# Patient Record
Sex: Female | Born: 1980
Health system: Southern US, Community
[De-identification: ages and names within clinical notes are randomized; demographics above are authoritative.]

## PROBLEM LIST (undated history)

## (undated) ENCOUNTER — Inpatient Hospital Stay (HOSPITAL_COMMUNITY): Payer: Self-pay

## (undated) DIAGNOSIS — F329 Major depressive disorder, single episode, unspecified: Secondary | ICD-10-CM

## (undated) DIAGNOSIS — E282 Polycystic ovarian syndrome: Secondary | ICD-10-CM

## (undated) DIAGNOSIS — R87619 Unspecified abnormal cytological findings in specimens from cervix uteri: Secondary | ICD-10-CM

## (undated) DIAGNOSIS — F99 Mental disorder, not otherwise specified: Secondary | ICD-10-CM

## (undated) DIAGNOSIS — R51 Headache: Secondary | ICD-10-CM

## (undated) DIAGNOSIS — N3010 Interstitial cystitis (chronic) without hematuria: Secondary | ICD-10-CM

## (undated) DIAGNOSIS — K589 Irritable bowel syndrome without diarrhea: Secondary | ICD-10-CM

## (undated) DIAGNOSIS — R7303 Prediabetes: Secondary | ICD-10-CM

## (undated) DIAGNOSIS — E119 Type 2 diabetes mellitus without complications: Secondary | ICD-10-CM

## (undated) DIAGNOSIS — F32A Depression, unspecified: Secondary | ICD-10-CM

## (undated) DIAGNOSIS — IMO0002 Reserved for concepts with insufficient information to code with codable children: Secondary | ICD-10-CM

## (undated) DIAGNOSIS — I10 Essential (primary) hypertension: Secondary | ICD-10-CM

## (undated) DIAGNOSIS — M797 Fibromyalgia: Secondary | ICD-10-CM

## (undated) DIAGNOSIS — F419 Anxiety disorder, unspecified: Secondary | ICD-10-CM

## (undated) DIAGNOSIS — K219 Gastro-esophageal reflux disease without esophagitis: Secondary | ICD-10-CM

## (undated) DIAGNOSIS — Z3490 Encounter for supervision of normal pregnancy, unspecified, unspecified trimester: Secondary | ICD-10-CM

## (undated) HISTORY — PX: KNEE SURGERY: SHX244

## (undated) HISTORY — DX: Reserved for concepts with insufficient information to code with codable children: IMO0002

## (undated) HISTORY — PX: CHOLECYSTECTOMY: SHX55

## (undated) HISTORY — PX: APPENDECTOMY: SHX54

## (undated) HISTORY — DX: Mental disorder, not otherwise specified: F99

## (undated) HISTORY — DX: Headache: R51

## (undated) HISTORY — DX: Major depressive disorder, single episode, unspecified: F32.9

## (undated) HISTORY — PX: BREAST REDUCTION SURGERY: SHX8

## (undated) HISTORY — DX: Unspecified abnormal cytological findings in specimens from cervix uteri: R87.619

## (undated) HISTORY — DX: Interstitial cystitis (chronic) without hematuria: N30.10

## (undated) HISTORY — DX: Depression, unspecified: F32.A

## (undated) HISTORY — DX: Gastro-esophageal reflux disease without esophagitis: K21.9

---
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
## 2002-05-01 ENCOUNTER — Ambulatory Visit (HOSPITAL_BASED_OUTPATIENT_CLINIC_OR_DEPARTMENT_OTHER): Admission: RE | Admit: 2002-05-01 | Discharge: 2002-05-02 | Payer: Self-pay | Admitting: Specialist

## 2002-05-01 ENCOUNTER — Encounter (INDEPENDENT_AMBULATORY_CARE_PROVIDER_SITE_OTHER): Payer: Self-pay | Admitting: Specialist

## 2002-06-18 ENCOUNTER — Encounter (INDEPENDENT_AMBULATORY_CARE_PROVIDER_SITE_OTHER): Payer: Self-pay | Admitting: *Deleted

## 2002-06-18 ENCOUNTER — Inpatient Hospital Stay (HOSPITAL_COMMUNITY): Admission: EM | Admit: 2002-06-18 | Discharge: 2002-06-20 | Payer: Self-pay | Admitting: Emergency Medicine

## 2002-06-18 ENCOUNTER — Encounter: Payer: Self-pay | Admitting: Emergency Medicine

## 2002-11-21 ENCOUNTER — Emergency Department (HOSPITAL_COMMUNITY): Admission: EM | Admit: 2002-11-21 | Discharge: 2002-11-21 | Payer: Self-pay | Admitting: Emergency Medicine

## 2002-12-21 ENCOUNTER — Encounter: Payer: Self-pay | Admitting: Family Medicine

## 2002-12-21 ENCOUNTER — Encounter: Admission: RE | Admit: 2002-12-21 | Discharge: 2002-12-21 | Payer: Self-pay | Admitting: Family Medicine

## 2003-05-11 ENCOUNTER — Emergency Department (HOSPITAL_COMMUNITY): Admission: EM | Admit: 2003-05-11 | Discharge: 2003-05-11 | Payer: Self-pay | Admitting: Emergency Medicine

## 2004-08-08 ENCOUNTER — Emergency Department (HOSPITAL_COMMUNITY): Admission: EM | Admit: 2004-08-08 | Discharge: 2004-08-08 | Payer: Self-pay | Admitting: Emergency Medicine

## 2005-03-24 ENCOUNTER — Ambulatory Visit (HOSPITAL_COMMUNITY): Admission: RE | Admit: 2005-03-24 | Discharge: 2005-03-24 | Payer: Self-pay | Admitting: Family Medicine

## 2006-09-17 ENCOUNTER — Inpatient Hospital Stay (HOSPITAL_COMMUNITY): Admission: AD | Admit: 2006-09-17 | Discharge: 2006-09-19 | Payer: Self-pay | Admitting: Obstetrics and Gynecology

## 2007-02-27 ENCOUNTER — Emergency Department (HOSPITAL_COMMUNITY): Admission: EM | Admit: 2007-02-27 | Discharge: 2007-02-27 | Payer: Self-pay | Admitting: Family Medicine

## 2007-03-15 ENCOUNTER — Emergency Department (HOSPITAL_COMMUNITY): Admission: EM | Admit: 2007-03-15 | Discharge: 2007-03-15 | Payer: Self-pay | Admitting: Family Medicine

## 2007-03-20 ENCOUNTER — Emergency Department (HOSPITAL_COMMUNITY): Admission: EM | Admit: 2007-03-20 | Discharge: 2007-03-21 | Payer: Self-pay | Admitting: Emergency Medicine

## 2007-05-03 ENCOUNTER — Emergency Department (HOSPITAL_COMMUNITY): Admission: EM | Admit: 2007-05-03 | Discharge: 2007-05-03 | Payer: Self-pay | Admitting: Family Medicine

## 2007-08-19 ENCOUNTER — Emergency Department (HOSPITAL_COMMUNITY): Admission: EM | Admit: 2007-08-19 | Discharge: 2007-08-19 | Payer: Self-pay | Admitting: Emergency Medicine

## 2007-08-30 ENCOUNTER — Emergency Department (HOSPITAL_COMMUNITY): Admission: EM | Admit: 2007-08-30 | Discharge: 2007-08-30 | Payer: Self-pay | Admitting: Emergency Medicine

## 2007-09-10 ENCOUNTER — Emergency Department (HOSPITAL_COMMUNITY): Admission: EM | Admit: 2007-09-10 | Discharge: 2007-09-10 | Payer: Self-pay | Admitting: Emergency Medicine

## 2007-11-06 ENCOUNTER — Emergency Department (HOSPITAL_COMMUNITY): Admission: EM | Admit: 2007-11-06 | Discharge: 2007-11-06 | Payer: Self-pay | Admitting: Emergency Medicine

## 2008-04-19 ENCOUNTER — Emergency Department (HOSPITAL_COMMUNITY): Admission: EM | Admit: 2008-04-19 | Discharge: 2008-04-19 | Payer: Self-pay | Admitting: Family Medicine

## 2008-05-01 ENCOUNTER — Emergency Department (HOSPITAL_COMMUNITY): Admission: EM | Admit: 2008-05-01 | Discharge: 2008-05-01 | Payer: Self-pay | Admitting: Emergency Medicine

## 2008-05-17 ENCOUNTER — Emergency Department (HOSPITAL_COMMUNITY): Admission: EM | Admit: 2008-05-17 | Discharge: 2008-05-18 | Payer: Self-pay | Admitting: Emergency Medicine

## 2008-06-04 ENCOUNTER — Ambulatory Visit (HOSPITAL_COMMUNITY): Admission: RE | Admit: 2008-06-04 | Discharge: 2008-06-04 | Payer: Self-pay | Admitting: Gastroenterology

## 2008-06-05 ENCOUNTER — Ambulatory Visit (HOSPITAL_COMMUNITY): Admission: RE | Admit: 2008-06-05 | Discharge: 2008-06-05 | Payer: Self-pay | Admitting: Gastroenterology

## 2008-08-29 ENCOUNTER — Emergency Department (HOSPITAL_COMMUNITY): Admission: EM | Admit: 2008-08-29 | Discharge: 2008-08-29 | Payer: Self-pay | Admitting: Family Medicine

## 2008-09-24 ENCOUNTER — Ambulatory Visit (HOSPITAL_COMMUNITY): Admission: RE | Admit: 2008-09-24 | Discharge: 2008-09-24 | Payer: Self-pay | Admitting: General Surgery

## 2008-09-24 ENCOUNTER — Encounter (INDEPENDENT_AMBULATORY_CARE_PROVIDER_SITE_OTHER): Payer: Self-pay | Admitting: General Surgery

## 2008-11-06 ENCOUNTER — Emergency Department (HOSPITAL_COMMUNITY): Admission: EM | Admit: 2008-11-06 | Discharge: 2008-11-06 | Payer: Self-pay | Admitting: Family Medicine

## 2008-11-26 ENCOUNTER — Encounter: Admission: RE | Admit: 2008-11-26 | Discharge: 2009-01-02 | Payer: Self-pay | Admitting: Family Medicine

## 2009-01-26 ENCOUNTER — Emergency Department (HOSPITAL_COMMUNITY): Admission: EM | Admit: 2009-01-26 | Discharge: 2009-01-26 | Payer: Self-pay | Admitting: Family Medicine

## 2009-04-01 ENCOUNTER — Emergency Department (HOSPITAL_COMMUNITY): Admission: EM | Admit: 2009-04-01 | Discharge: 2009-04-01 | Payer: Self-pay | Admitting: Family Medicine

## 2009-04-17 ENCOUNTER — Emergency Department (HOSPITAL_COMMUNITY): Admission: EM | Admit: 2009-04-17 | Discharge: 2009-04-17 | Payer: Self-pay | Admitting: Family Medicine

## 2009-05-29 ENCOUNTER — Emergency Department (HOSPITAL_COMMUNITY): Admission: EM | Admit: 2009-05-29 | Discharge: 2009-05-29 | Payer: Self-pay | Admitting: Emergency Medicine

## 2009-08-13 ENCOUNTER — Emergency Department (HOSPITAL_COMMUNITY): Admission: EM | Admit: 2009-08-13 | Discharge: 2009-08-13 | Payer: Self-pay | Admitting: Family Medicine

## 2009-08-15 IMAGING — CR DG ABDOMEN 2V
2 series · 2 of 2 positions shown · non-contrast
Comparison: None

CLINICAL DATA: Shortness of breath, vomiting

ABDOMEN - 2 VIEW

[view not recorded (1 of 2)]
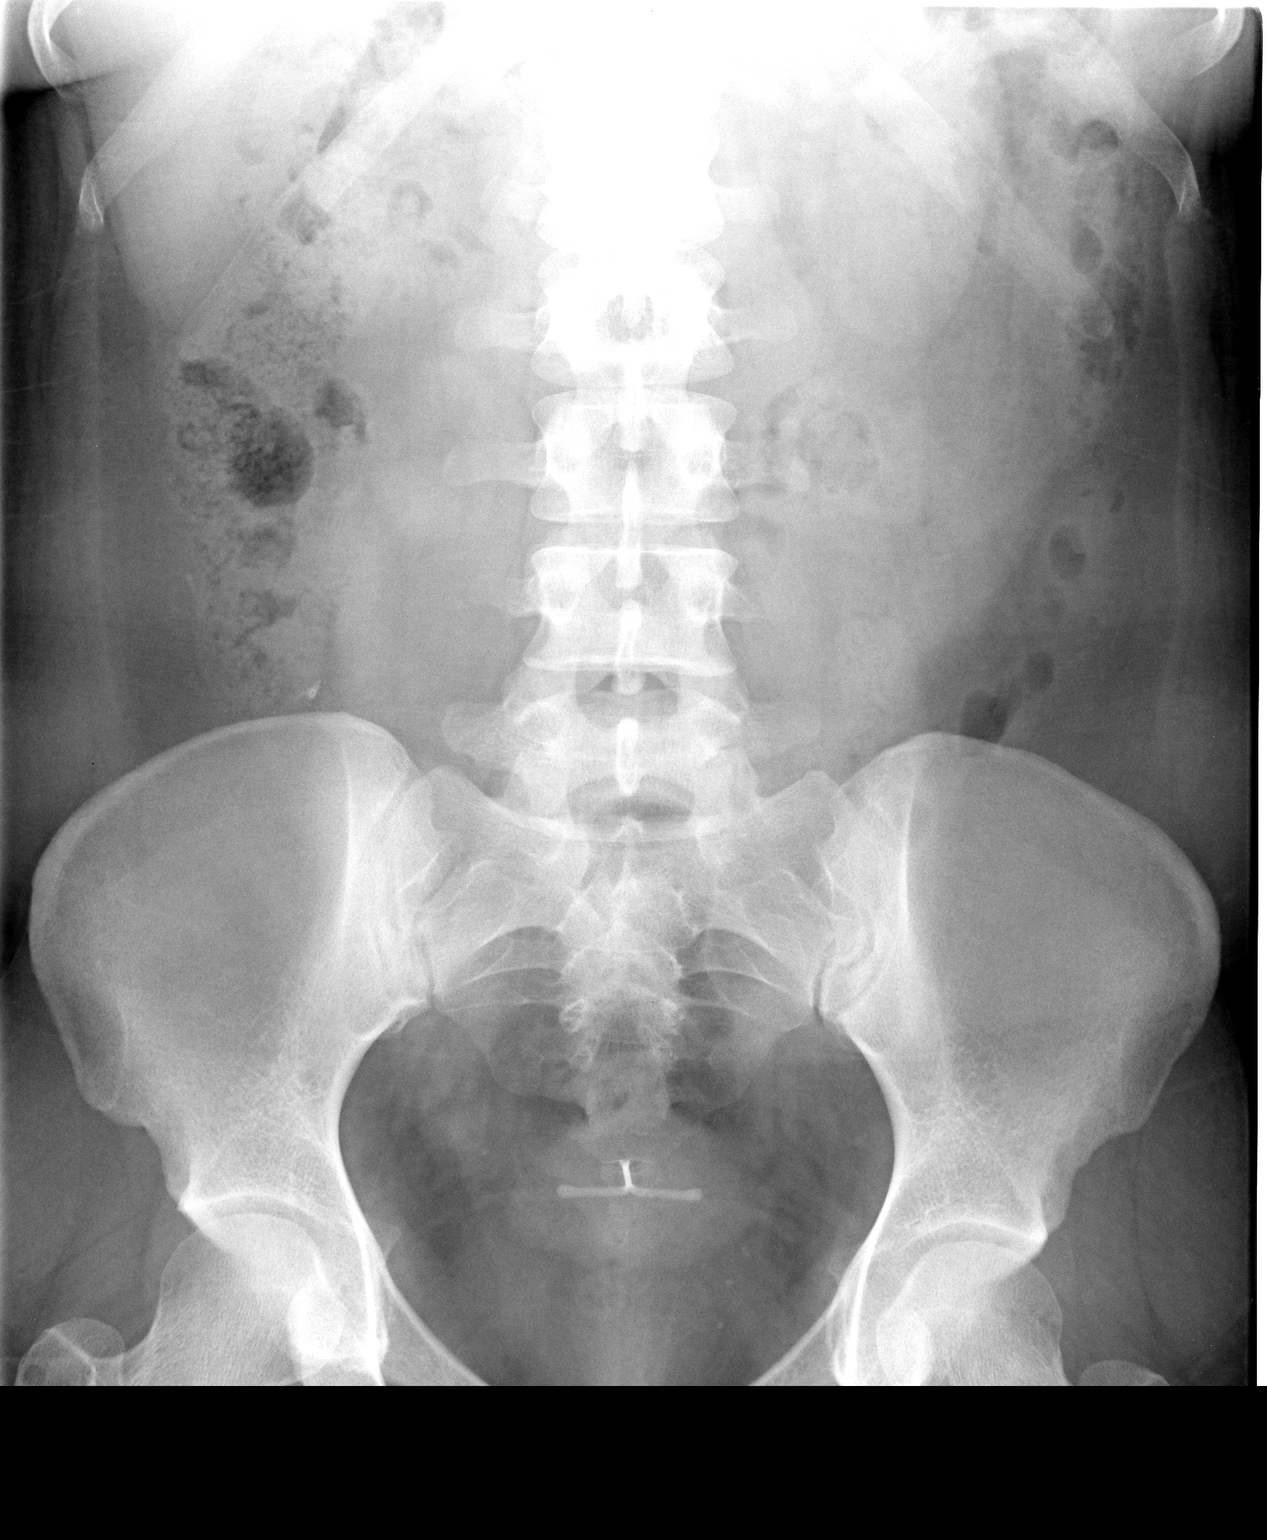

[view not recorded (2 of 2)]
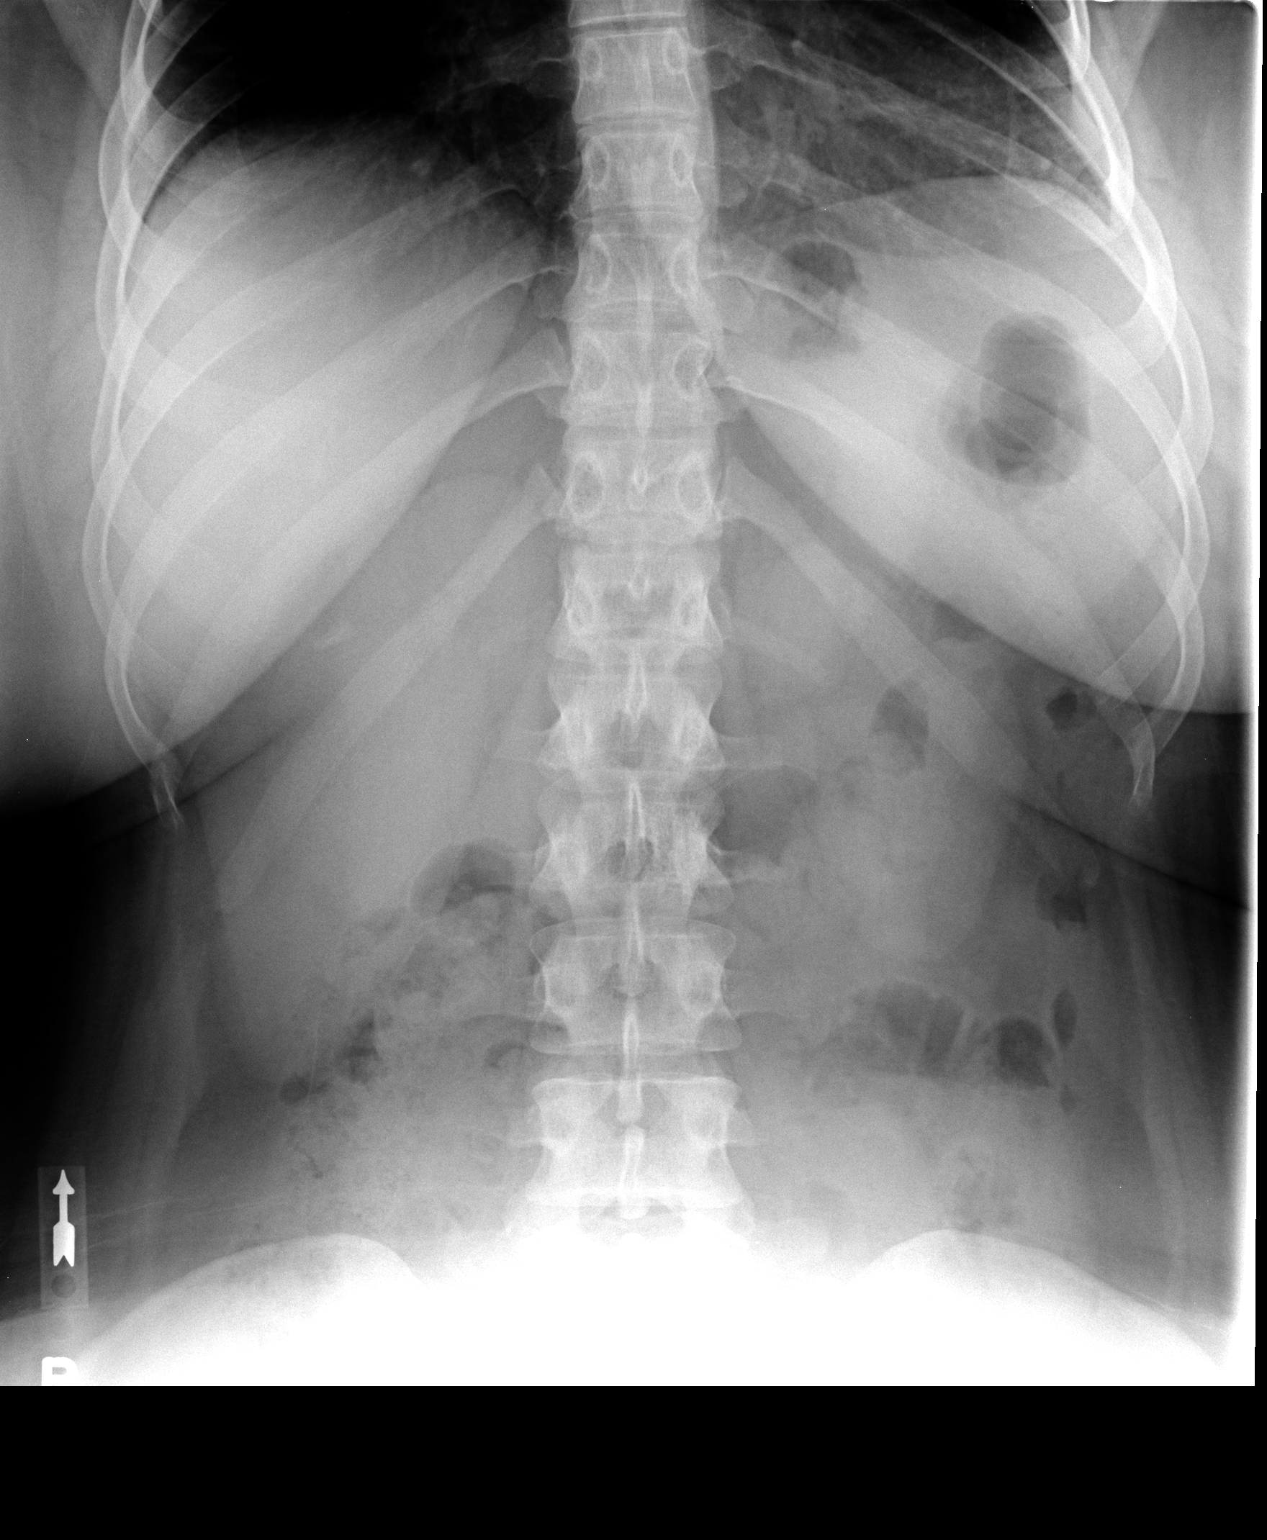

[2 of 2 positions shown; findings below may reference images not displayed]

FINDINGS: There is normal bowel gas pattern.  No free air.  No
organomegaly or suspicious calcification.  Visualized skeleton
unremarkable.  IUD is in place.
IMPRESSION: No acute findings.

## 2009-08-15 IMAGING — CR DG CHEST 2V
2 series · 2 of 2 positions shown · non-contrast
Comparison: 11/06/2007

CLINICAL DATA: Shortness of breath

CHEST - 2 VIEW

[view not recorded (1 of 2)]
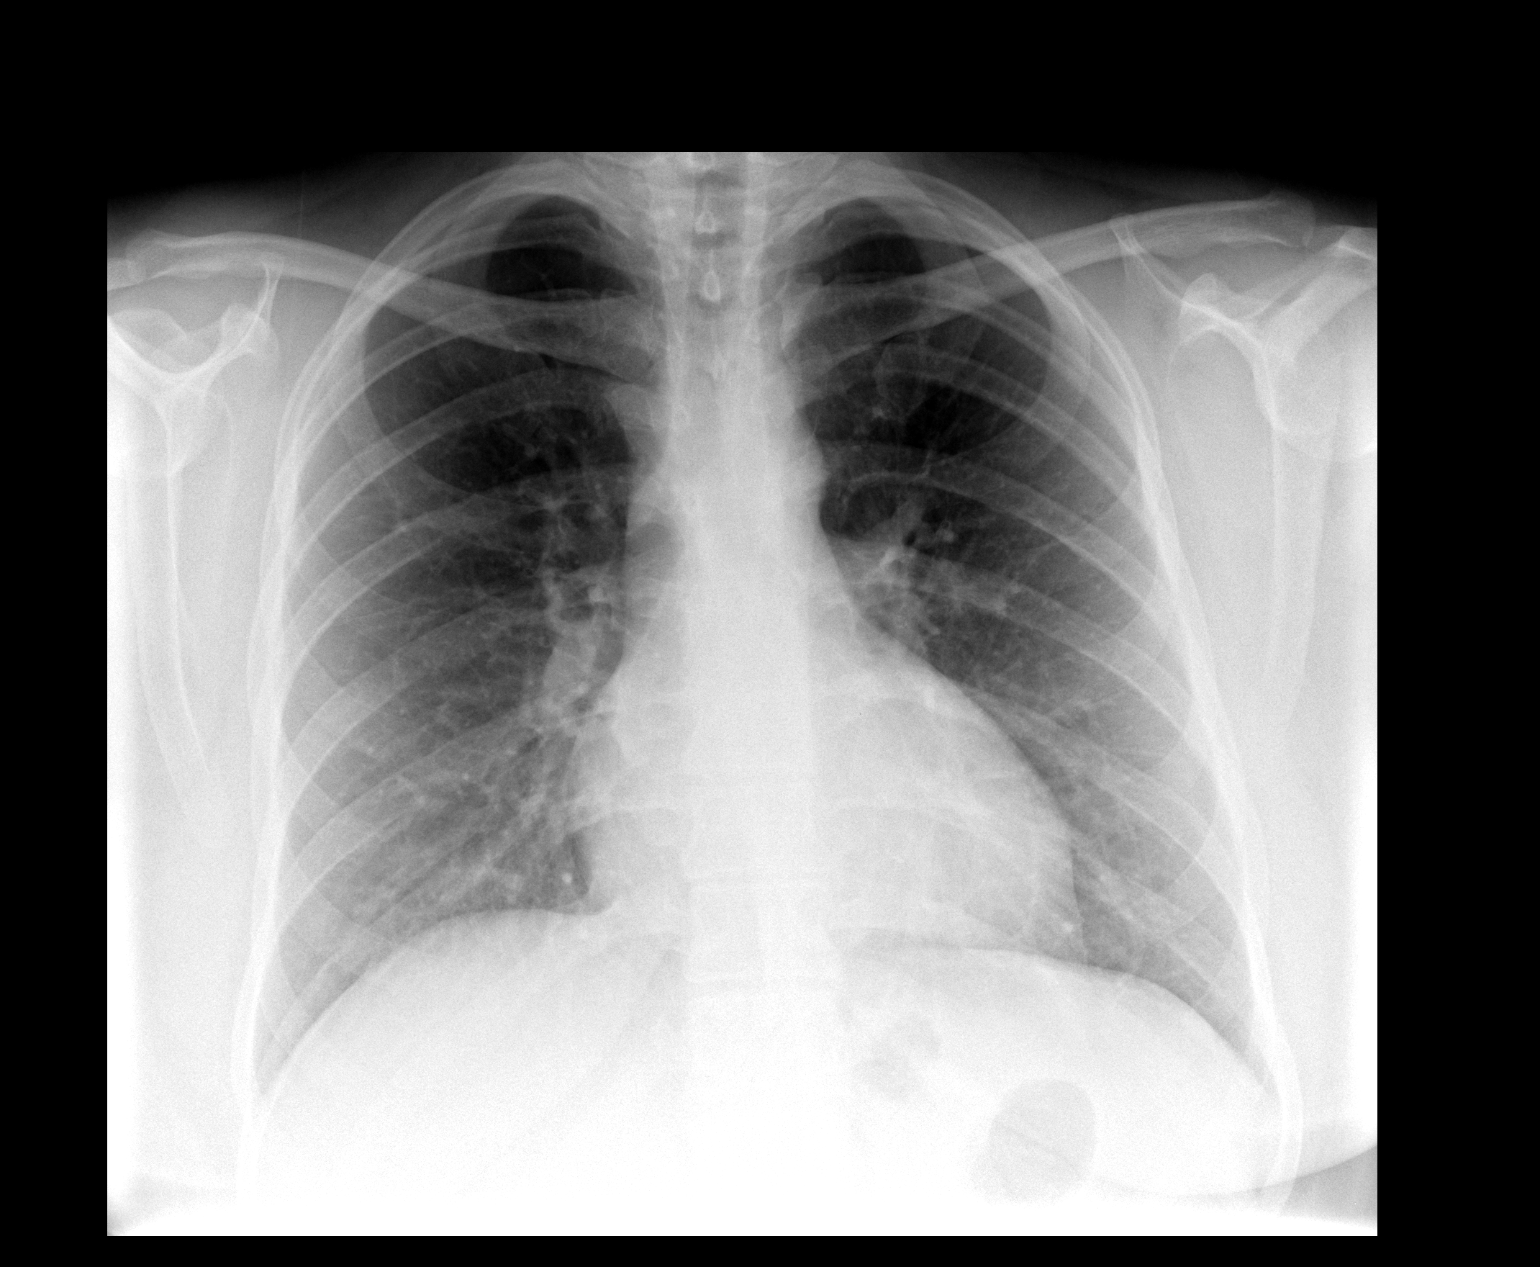

[view not recorded (2 of 2)]
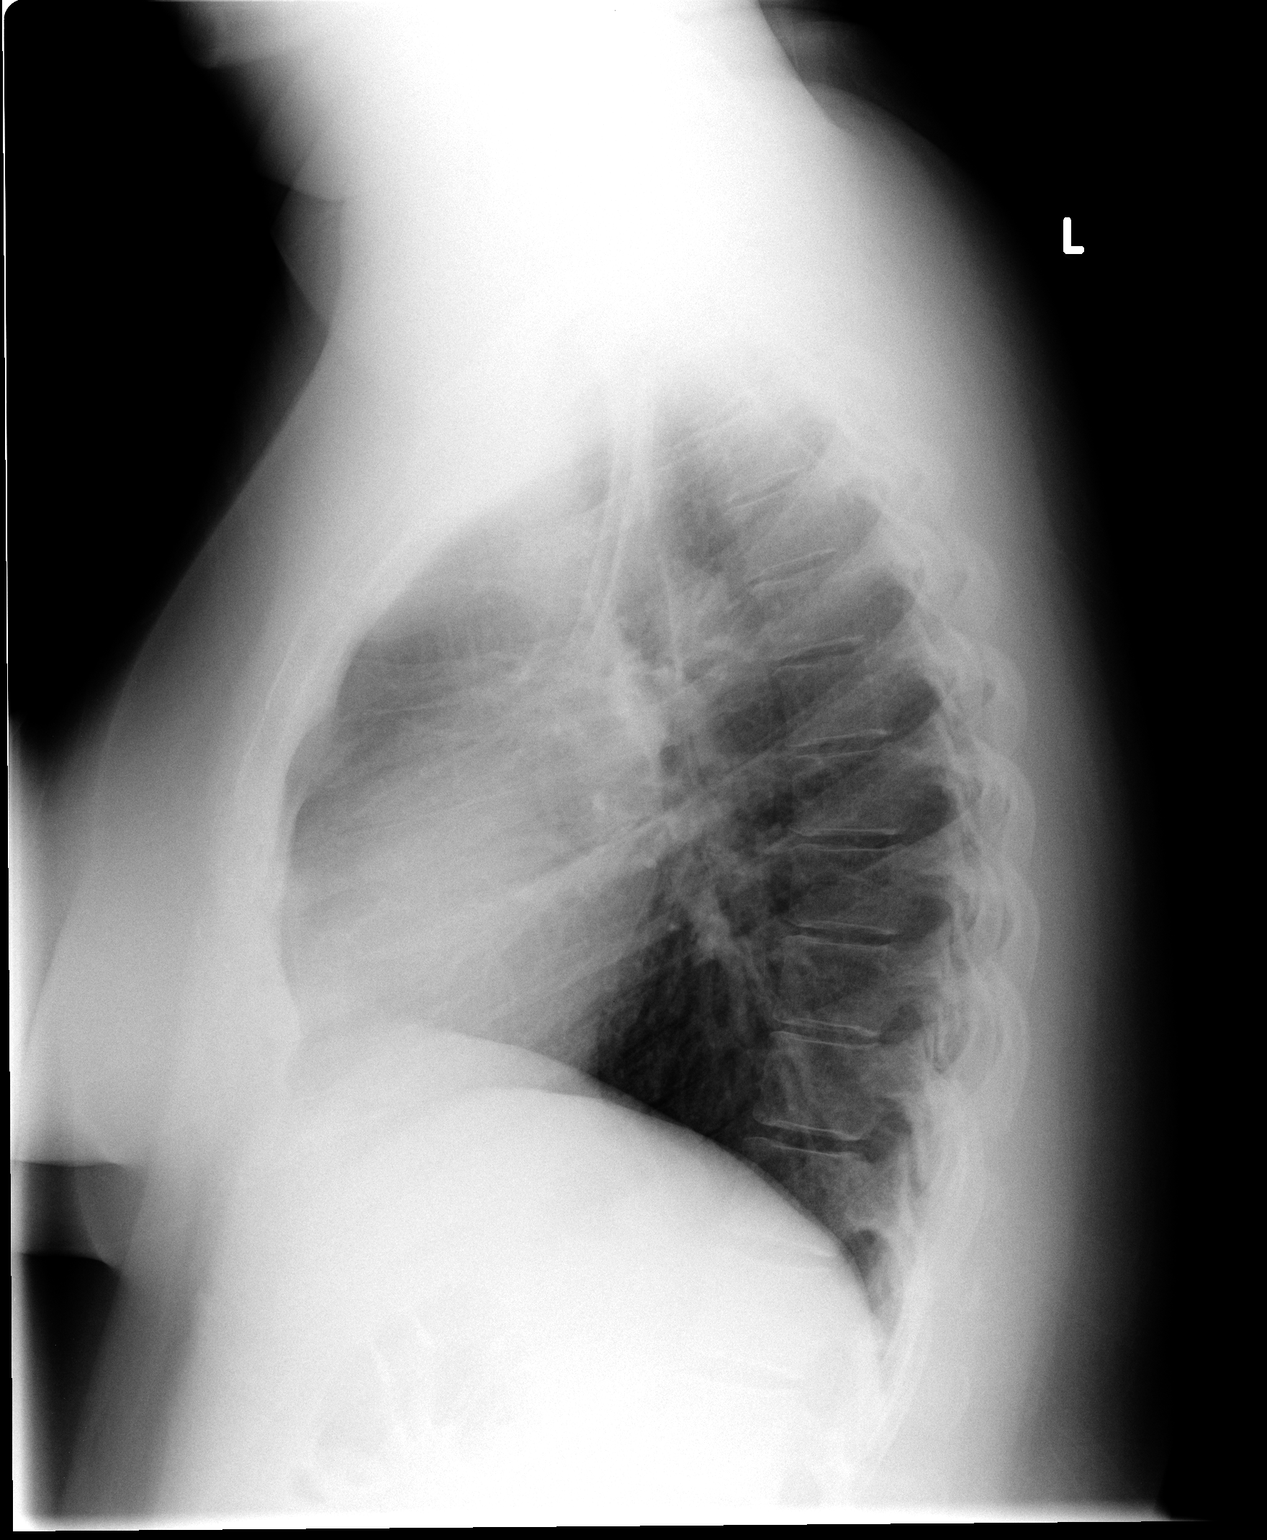

[2 of 2 positions shown; findings below may reference images not displayed]

FINDINGS: Heart and mediastinal contours are within normal limits.
No focal opacities or effusions.  No acute bony abnormality.
IMPRESSION: No active disease.

## 2010-01-02 ENCOUNTER — Emergency Department (HOSPITAL_COMMUNITY): Admission: EM | Admit: 2010-01-02 | Discharge: 2010-01-02 | Payer: Self-pay | Admitting: Family Medicine

## 2010-07-15 ENCOUNTER — Emergency Department (HOSPITAL_COMMUNITY): Admission: EM | Admit: 2010-07-15 | Discharge: 2010-07-15 | Payer: Self-pay | Admitting: Family Medicine

## 2010-08-10 ENCOUNTER — Emergency Department (HOSPITAL_COMMUNITY): Admission: EM | Admit: 2010-08-10 | Discharge: 2010-08-10 | Payer: Self-pay | Admitting: Family Medicine

## 2010-11-13 ENCOUNTER — Emergency Department (HOSPITAL_COMMUNITY)
Admission: EM | Admit: 2010-11-13 | Discharge: 2010-11-13 | Payer: Self-pay | Source: Home / Self Care | Admitting: Family Medicine

## 2010-11-13 LAB — POCT RAPID STREP A (OFFICE): Streptococcus, Group A Screen (Direct): NEGATIVE

## 2010-11-15 ENCOUNTER — Emergency Department (HOSPITAL_COMMUNITY)
Admission: EM | Admit: 2010-11-15 | Discharge: 2010-11-15 | Payer: Self-pay | Source: Home / Self Care | Admitting: Family Medicine

## 2010-11-17 ENCOUNTER — Emergency Department (HOSPITAL_COMMUNITY)
Admission: EM | Admit: 2010-11-17 | Discharge: 2010-11-17 | Payer: Self-pay | Source: Home / Self Care | Admitting: Family Medicine

## 2010-12-13 ENCOUNTER — Inpatient Hospital Stay (INDEPENDENT_AMBULATORY_CARE_PROVIDER_SITE_OTHER)
Admission: RE | Admit: 2010-12-13 | Discharge: 2010-12-13 | Disposition: A | Discharge status: Home / Self Care | Payer: 59 | Source: Ambulatory Visit | Attending: Family Medicine | Admitting: Family Medicine

## 2010-12-13 DIAGNOSIS — H6980 Other specified disorders of Eustachian tube, unspecified ear: Secondary | ICD-10-CM

## 2010-12-28 ENCOUNTER — Inpatient Hospital Stay (HOSPITAL_COMMUNITY): Payer: 59

## 2010-12-28 ENCOUNTER — Inpatient Hospital Stay (INDEPENDENT_AMBULATORY_CARE_PROVIDER_SITE_OTHER)
Admission: RE | Admit: 2010-12-28 | Discharge: 2010-12-28 | Disposition: A | Discharge status: Home / Self Care | Payer: 59 | Source: Ambulatory Visit | Attending: Emergency Medicine | Admitting: Emergency Medicine

## 2010-12-28 ENCOUNTER — Inpatient Hospital Stay (HOSPITAL_COMMUNITY)
Admission: AD | Admit: 2010-12-28 | Discharge: 2010-12-28 | Disposition: A | Discharge status: Home / Self Care | Payer: 59 | Source: Ambulatory Visit | Attending: Obstetrics & Gynecology | Admitting: Obstetrics & Gynecology

## 2010-12-28 DIAGNOSIS — N949 Unspecified condition associated with female genital organs and menstrual cycle: Secondary | ICD-10-CM

## 2010-12-28 DIAGNOSIS — N83209 Unspecified ovarian cyst, unspecified side: Secondary | ICD-10-CM

## 2010-12-28 DIAGNOSIS — R1032 Left lower quadrant pain: Secondary | ICD-10-CM

## 2010-12-28 LAB — POCT URINALYSIS DIPSTICK
Bilirubin Urine: NEGATIVE
Hgb urine dipstick: NEGATIVE
Nitrite: NEGATIVE
Protein, ur: NEGATIVE mg/dL
Specific Gravity, Urine: >=1.03 (ref 1.005–1.030)
Urine Glucose, Fasting: NEGATIVE mg/dL
Urobilinogen, UA: 0.2 mg/dL (ref 0.0–1.0)
pH: 5.5 (ref 5.0–8.0)

## 2010-12-28 LAB — CBC
HCT: 42.9 % (ref 36.0–46.0)
Hemoglobin: 14.4 g/dL (ref 12.0–15.0)
MCH: 30.2 pg (ref 26.0–34.0)
MCHC: 33.6 g/dL (ref 30.0–36.0)
MCV: 89.9 fL (ref 78.0–100.0)
Platelets: 325 10*3/uL (ref 150–400)
RBC: 4.77 MIL/uL (ref 3.87–5.11)
RDW: 13.8 % (ref 11.5–15.5)
WBC: 14.3 10*3/uL — ABNORMAL HIGH (ref 4.0–10.5)

## 2010-12-28 LAB — WET PREP, GENITAL
Clue Cells Wet Prep HPF POC: NONE SEEN
Trich, Wet Prep: NONE SEEN
Yeast Wet Prep HPF POC: NONE SEEN

## 2010-12-28 LAB — POCT PREGNANCY, URINE: Preg Test, Ur: NEGATIVE

## 2010-12-28 LAB — HCG, QUANTITATIVE, PREGNANCY: hCG, Beta Chain, Quant, S: <2 m[IU]/mL (ref <5)

## 2010-12-29 LAB — GC/CHLAMYDIA PROBE AMP, GENITAL: GC Probe Amp, Genital: NEGATIVE

## 2011-01-22 LAB — POCT URINALYSIS DIPSTICK
Bilirubin Urine: NEGATIVE
Glucose, UA: NEGATIVE mg/dL
Hgb urine dipstick: NEGATIVE
Nitrite: NEGATIVE
Nitrite: NEGATIVE
Protein, ur: 30 mg/dL — AB
Urobilinogen, UA: 0.2 mg/dL (ref 0.0–1.0)
pH: 7 (ref 5.0–8.0)
pH: 6.5 (ref 5.0–8.0)

## 2011-01-22 LAB — POCT I-STAT, CHEM 8
Calcium, Ion: 1.23 mmol/L (ref 1.12–1.32)
Creatinine, Ser: 0.9 mg/dL (ref 0.4–1.2)
Glucose, Bld: 97 mg/dL (ref 70–99)
Glucose, Bld: 77 mg/dL (ref 70–99)
HCT: 42 % (ref 36.0–46.0)
HCT: 43 % (ref 36.0–46.0)
Hemoglobin: 14.3 g/dL (ref 12.0–15.0)
Hemoglobin: 14.6 g/dL (ref 12.0–15.0)
Sodium: 141 mEq/L (ref 135–145)
TCO2: 28 mmol/L (ref 0–100)
TCO2: 28 mmol/L (ref 0–100)

## 2011-01-22 LAB — POCT PREGNANCY, URINE: Preg Test, Ur: NEGATIVE

## 2011-03-24 NOTE — Op Note (Signed)
[name], [name]                  ACCOUNT NO.:  [id number]   MEDICAL RECORD NO.:  [id number]          PATIENT TYPE:  AMB   LOCATION:  DAY                          FACILITY:  [facility]   PHYSICIAN:  Adolph Pollack, M.D.DATE OF BIRTH:  1981-01-20   DATE OF PROCEDURE:  09/24/2008  DATE OF DISCHARGE:                               OPERATIVE REPORT   PREOPERATIVE DIAGNOSIS:  Chronic cholecystitis.   POSTOPERATIVE DIAGNOSIS:  Chronic cholecystitis.   PROCEDURE:  Laparoscopic cholecystectomy with intraoperative  cholangiogram.   SURGEON:  Adolph Pollack, M.D.   ASSISTANT:  Anselm Pancoast. Zachery Dakins, M.D.   ANESTHESIA:  General.   INDICATIONS:  Ms. [name] is a 30 year old female who has been suffering  from right upper quadrant type pain, sometimes postprandial.  She has a  history of gallbladder disease.  She has had an abdominal ultrasound and  nuclear medicine hepatic biliary scan but none of this has been  abnormal.  Upper endoscopy was been negative.  Proton pump inhibitors to  not alleviate her symptoms.  A CT scan was unremarkable.  Because her  biliary colic type symptoms, she was sent to me for consideration of  cholecystectomy and she and I had a long discussion about this.  After  discussing this and quoting her the potential of the risks and the  possibility that this may not alleviate her symptoms, she has decided to  proceed.  Of note was that she awoke this morning with some area on her  left calf which she thinks might be a spider bite.  On examination there  is a dull red area but no fluctuance.   TECHNIQUE:  She is brought to the operating room, placed supine on the  operating table and general anesthetic was administered.  The abdominal  wall was sterilely prepped and draped.  Marcaine was infiltrated in the  subumbilical region.  A subumbilical incision was made through the skin,  subcutaneous tissue and fascia and the peritoneal cavity was entered  under direct  vision.  A pursestring suture of 0 Vicryl was placed around  the fascial edges.  A Hasson trocar was introduced into the peritoneal  cavity and pneumoperitoneum created by insufflation of CO2 gas.   The laparoscope was introduced and she was placed in reverse  Trendelenburg position with the right side tilted up.  A 11 mm trocar  was placed through an epigastric incision and two 5 mm trocars placed  through right upper quadrant incisions.  I identified the gallbladder  and there was significant adhesions between the omentum and the  gallbladder.  Using blunt dissection I dissected the omentum free from  the gallbladder and then retracted the fundus of the gallbladder toward  the right shoulder.  The infundibulum of the gallbladder was retracted  laterally and using blunt dissection, it was mobilized.  I identified  the cystic duct and created a window around it.  A blood vessel was  running directly anterior to the cystic duct.  This was isolated,  clipped and divided.  A small incision was then made into the cystic  duct.  A cholangiocatheter was placed through the anterior abdominal  wall into the cystic duct and cholangiogram performed.   Under real time fluoroscopy dilute contrast was injected to the cystic  duct.  There is a small amount of extravasation where the catheter went  into the duct.  Common hepatic, right and left hepatic, common bile  ducts all filled promptly with contrast.  Contrast drained promptly into  the duodenum without obvious evidence of obstruction.  Final reports  pending radiologist interpretation.   The cholangiocatheter was removed, the cystic duct was clipped three  times on the biliary side and divided.  I identified the anterior and  posterior branch of the cystic artery.  These were clipped and divided.  I then dissected the gallbladder free from liver using electrocautery.  Small puncture wound was made in the gallbladder and there was some  leakage  of bile.  Gallbladder was placed in Endopouch bag.   The gallbladder fossa and perihepatic area were copiously irrigated with  saline solution and solution evacuated.  Upon inspection of the  gallbladder fossa.  Hemostasis was adequate.  There is no  evidence of  bile leak.   The gallbladder was then removed through the subumbilical port in this  Endopouch bag.  We then closed the subumbilical fascial defect by  tightening up and tying down the pursestring suture which left the  fascial gap.  I closed the fascial gap with a single 0 Vicryl figure-of-  eight suture adequately.  I evacuated as much as the irrigation fluid as  possible.  Further inspection of the gallbladder fossa again  demonstrated no evidence of bleeding or bile leak.   The trocars were then removed and pneumoperitoneum released.  The skin  incisions were closed with 4-0 Monocryl subcuticular stitches.  Steri-  Strips and sterile dressings were applied.  She tolerated the procedure  well without apparent complications and was taken to recovery in  satisfactory condition.      Adolph Pollack, M.D.  Electronically Signed     TJR/MEDQ  D:  09/24/2008  T:  09/25/2008  Job:  [id number]   cc:   Fayrene Fearing L. Malon Kindle., M.D.  Fax: [phone]

## 2011-03-24 NOTE — Op Note (Signed)
[name], [name]                  ACCOUNT NO.:  [id number]   MEDICAL RECORD NO.:  [id number]          PATIENT TYPE:  AMB   LOCATION:  ENDO                         FACILITY:  [facility]   PHYSICIAN:  James L. Malon Kindle., M.D.DATE OF BIRTH:  Oct 02, 1981   DATE OF PROCEDURE:  06/04/2008  DATE OF DISCHARGE:                               OPERATIVE REPORT   PROCEDURE:  Esophagogastroduodenoscopy.   MEDICATIONS:  1. Cetacaine spray.  2. Fentanyl 75 mcg.  3. Versed 7 mg IV.   INDICATION:  Right upper quadrant pain of unclear cause.   DESCRIPTION OF PROCEDURE:  Procedure explained to the patient, consent  obtained.  In the left lateral decubitus position, the Pentax upper  scope was inserted and advanced.  The stomach was entered and the  pylorus identified and passed.  The duodenum including the bulb and  second portion were seen well.  There was green bile in the duodenum.  There were no ulceration, inflammation, or erosions whatsoever.  The  port channel, antrum, and body of the stomach were normal.  Fundus and  cardia were seen on the retroflex view.  There was a hiatal hernia with  a fairly patent GE junction.  The distal and proximal esophagus were  endoscopically normal.  The scope was withdrawn.  The patient tolerated  the procedure well.  There was no signs of esophagitis.   ASSESSMENT:  Right upper quadrant pain of unclear cause.  At this point,  this woman has had a gallbladder ultrasound as well as a stimulated  hepatobiliary scan revealing the gallbladder ejection fraction of 95%.  There is nothing on upper endoscopy to explain her pain.   PLAN:  We will give her a trial of Donnatal tablets 2 every 8 hours  p.r.n. for pain.  She will be warned that this could make her sleepy,  and we will obtain a CT of her abdomen and pelvis to look at her  pancreas, etc.  She will follow up in the office with me in about 2  weeks.           ______________________________  [name]  Malon Kindle., M.D.     Waldron Session  D:  06/04/2008  T:  06/04/2008  Job:  [id number]   cc:   Tammy R. Collins [place], M.D.

## 2011-03-27 NOTE — Op Note (Signed)
[place]. [facility]  Patient:    [name], SMOOT Visit Number: [id number] MRN: [id number]          Service Type: DSU Location: [facility] Attending Physician:  Gustavus Messing Dictated by:   Yaakov Guthrie. Shon Hough, M.D. Proc. 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  The patient also has pitting of both shoulder areas.  PROCEDURES PLANNED:  1. Bilateral breast reduction using the inferior pedicle technique.  2. Removal of ancillary breast tissue, right and left axillary area and     latissimus dorsi regions.  SURGEON: Yaakov Guthrie. Shon Hough, M.D.  ANESTHESIA: General.  PROCEDURE: The patient was sat up and drawn for the inferior pedicle reduction mammoplasty drawing, remarking the nipple/areolar complexes back to 20 cm from the suprasternal notch.  She then underwent general anesthesia, intubated orally.  Prep was done to the chest and breast areas with Betadine soap and solution and walled off with sterile towels and drapes so as to make a sterile field.  Xylocaine 0.25% with epinephrine was injected locally, 1:400,000 concentration, a total of 150 cc per side.  The wounds were scored with a #15 blade and then the skin of the inferior pedicle de-epithelialized with a #20 blade.  The medial and lateral fatty dermal pedicles were excised down to underlying fascia.  Out laterally more tissue was removed using the Bovie unit and in the latissimus dorsi and axillary regions large amounts of accessory breast tissue removed using liposuction assistance, [place] catheter 28 - 3 and 4.  Next a new keyhole area was debulked and then the flaps were transposed and stayed with 3-0 Prolene sutures.  Subcutaneous  closure was done with 3-0 Monocryl x2 layers and then a running subcuticular stitch of 3-0 Monocryl and 5-0 Monocryl throughout the inverted T.  The wounds were cleansed and they were drained with #10 Blake drains, fully fluted, which were placed in the depths of the wound and brought out through the lateral most portion of the incision and secured with 3-0 Prolene.  The wounds were cleansed and Steri-Strips and soft dressing applied of Xeroform, 4 x 4s, ABDs.  The same procedure was carried out on both sides, removing 1000 g per side.  She withstood the procedures very well.  Estimated blood loss was less than 100 cc.  At the end of the procedure the nipple/areolar complexes were examined with good suppleness and good blood supply. Dictated by:   Yaakov Guthrie. Shon Hough, M.D. Attending Physician:  Gustavus Messing DD:  05/01/02 TD:  05/02/02 Job: [id number]

## 2011-03-27 NOTE — Op Note (Signed)
[name], [name]                    ACCOUNT NO.:  [id number]   MEDICAL RECORD NO.:  [id number]                   PATIENT TYPE:  INP   LOCATION:  1832                                 FACILITY:  [facility]   PHYSICIAN:  Adolph Pollack, M.D.            DATE OF BIRTH:  26-Jan-1981   DATE OF PROCEDURE:  06/18/2002  DATE OF DISCHARGE:                                 OPERATIVE REPORT   PREOPERATIVE DIAGNOSIS:  Acute appendicitis.   POSTOPERATIVE DIAGNOSIS:  Acute appendicitis.   PROCEDURE:  Laparoscopic appendectomy.   SURGEON:  Adolph Pollack, M.D.   ANESTHESIA:  General.   INDICATIONS:  This 30 year old female presented to the emergency department  with increasing right lower quadrant pain and was evaluated and noted to  have a white blood cell count elevated to 14,000.  A CT scan was consistent  with appendicitis.  She has right lower quadrant tenderness and guarding and  some peritoneal signs.  She was brought to the operating room.   DESCRIPTION OF PROCEDURE:  She was placed supine on the operating table and  general anesthetic was administered.  A Foley catheter was placed in her  bladder, then her abdomen was sterilely prepped and draped.  A small  subumbilical incision made, incising the skin sharply.  The subcutaneous  tissue was dissected bluntly until the midline fascia was identified and a  small incision was made in the midline fascia, and the peritoneal cavity was  entered bluntly and under direct vision.  A pursestring suture of 0 Vicryl  was placed around the fascial edges.  A Hasson trocar was introduced to the  peritoneal cavity and pneumoperitoneum created with insufflation of CO2 gas.  Next a laparoscope was introduced.   There was minimal if any free pelvic fluid noted.  Ovaries appeared to be  relatively normal with just some very small cysts on the right ovary.   The patient was placed in Trendelenburg position and tilted toward the left.  A 5  mm trocar was then placed in the lower midline and one also was placed  in the right upper quadrant.  The cecum was identified, manipulated, and an  indurated, mildly inflamed appendix was noted.  Then the mesoappendix was  grasped and retracted anteriorly, and then the mesoappendix was divided with  a Harmonic scalpel down to the base of the cecum.  Using the Endo-GIA  stapler, the appendix was then amputated from the cecum and placed in an  Endopouch bag and removed from the abdominal cavity.  Once this was done,  the Hasson trocar was placed back into the abdominal cavity and the staple  line inspected.  No stool leakage or no bleeding was noted.  One liter of  saline was used to copiously irrigate out the abdominal cavity, and as much  of the saline as possible was evacuated.  I inspected the staple line one  more time, and again it was hemostatic.  Under direct vision the lower abdominal 5 mm trocar and the subumbilical  trocar were removed.  The fascial defect was closed under laparoscopic  vision by tightening up and tying down the pursestring suture.  The  pneumoperitoneum was released, and the 5 mm upper abdominal trocar was  removed.  Skin incisions were closed with 4-0 Monocryl subcuticular  stitches.  Steri-Strips and sterile dressings were applied.   She tolerated the procedure well without any apparent complications and was  taken to the recovery room in satisfactory condition.                                               Adolph Pollack, M.D.    Kari Baars  D:  06/18/2002  T:  06/20/2002  Job:  [id number]

## 2011-03-27 NOTE — Discharge Summary (Signed)
[name], [name]                  ACCOUNT NO.:  [id number]   MEDICAL RECORD NO.:  [id number]          PATIENT TYPE:  INP   LOCATION:  9115                          FACILITY:  [facility]   PHYSICIAN:  [name], M.D. DATE OF BIRTH:  [birth year]   DATE OF ADMISSION:  09/17/2006  DATE OF DISCHARGE:  09/19/2006                                 DISCHARGE SUMMARY   ADMITTING DIAGNOSIS:  Intrauterine pregnancy at term with rupture of  membranes.   DISCHARGE DIAGNOSIS:  Intrauterine pregnancy at term with rupture of  membranes, delivered via spontaneous vaginal delivery.   HPI:  This 30 year old G1, P0 presents with rupture of membranes.  Her LMP  was December 22, 2005, with an [facility] of September 27, 2006.  She began her  prenatal care in [place] but had transferred it to [facility].  She had SROM at 2:30 in the morning the date of admission, clear fluid.  She  states baby was moving well and she was admitted to L&D and Pitocin was  begun.   PRENATAL LABS:  A positive, antibody screen negative, RPR nonreactive,  rubella immune, Hepatitis B Surface Antigen, HIV negative, gonorrhea and  Chlamydia negative, triple screen negative.  One hour Glucola 144, 3-hour  GTT 87, 153, 130 and 75.  Group B Strep was positive.  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  This will be repeated at her postpartum checkup.  She had an ultrasound on  the 27th of June giving a gestational age of [redacted] weeks, confirmed her  estimated date of confinement.   PAST MEDICAL HISTORY:  Significant for asthma.   PAST SURGICAL HISTORY:  Significant for:  1. Breast reduction.  2. Appendectomy.   PAST OBGYN HISTORY:  1. G1 is the current pregnancy.  2. History of an abnormal Pap smear with this pregnancy.   MEDICATIONS:  Prenatal vitamins, Zyrtec, Singulair and albuterol.   NO KNOWN DRUG ALLERGIES.   At admission her cervix was 1 cm dilated, 50% effaced  and -2 station and was  vertex.  She was also treated with penicillin, was admitted to Labor and  Delivery.  She had a routine intrapartum course and dilated appropriately.  She had an IUPC and scalp lead placed at approximately 6:30 p.m.  Her fetal  heart tones at this time are 150-160's with good beat-to-beat variability  after the scalp lead was placed but some variable decelerations.  She  progressed to complete _complete and +2, pushed with severe variables but a  quick return to baseline to deliver a viable female infant with a single  nuchal cord at 2310.  Apgars were 7 at one minute, 9 at five minutes and  weight was 7 pounds 8 ounces.  Placenta was delivered intact at 2315.  She  had a first degree perineal that was repaired with #3-0 Vicryl in a normal  fashion.  Slit uterine atony was relieved with bimanual massage.  The EBL  was approximately 500 mL.  Maternal temperature was slightly elevated.  Immediately prior  to delivery, at approximately 100.3, there was fetal  tachycardia.  The temperature was quickly relieved after her delivery, it  was closely monitored.  Circumcision risks, benefits and alternatives for  the infant were discussed with the patient and she decided to proceed with  this for the baby.  Postpartum course was relatively uncomplicated.  She  remained afebrile with stable vital signs.  Hemoglobin decreased from 12.3  peripartum to 10.8 postpartum.  She is discharged to home on postpartum day  #2 with prescription for Motrin and prenatal vitamins while she is  breastfeeding as well as birth control pills which she desired oral  contraceptive pills; however, these will be started at her 6 week checkup.  She is Rubella immune and A positive.  She was given appropriate discharge  instructions and numbers to call with any questions or problems.  She voiced  understanding to all of this.      Julie Monday, MD  Electronically Signed      ______________________________  [name], M.D.    [name]  D:  09/19/2006  T:  09/19/2006  Job:  [id number]

## 2011-03-27 NOTE — H&P (Signed)
NAMEHUNTLEY, DEMEDEIROS                    ACCOUNT NO.:  [id number]   MEDICAL RECORD NO.:  [id number]                   PATIENT TYPE:  INP   LOCATION:  5714                                 FACILITY:  [facility]   PHYSICIAN:  Adolph Pollack, M.D.            DATE OF BIRTH:  [birth year]   DATE OF ADMISSION:  06/18/2002  DATE OF DISCHARGE:  06/20/2002                                HISTORY & PHYSICAL   CHIEF COMPLAINT:  Right lower quadrant.   HISTORY OF PRESENT ILLNESS:  This is an 30 year old female who had the acute  onset of what she described right lower quadrant pain that became dull and  persistent.  She had some nausea but no fever or chills and no vomiting.  She had something to eat about 12:00.  She presented to the emergency  department because of the persistence of the pain.  She denied dysuria or  diarrhea.  While in the emergency department she underwent a CT scan of the  abdomen which demonstrated findings consistent with acute appendicitis and I  was subsequently asked to see her.   PAST MEDICAL HISTORY:  1. Asthma.  2) Depression.  3) Interstitial cystitis.  4) Fibromyalgia.  5)     Chronic fatigue syndrome.   PREVIOUS OPERATIONS:  Cystoscopy.  Bilateral breast reduction.   ALLERGIES:  None known.   MEDICATIONS:  Prozac, Zyrtec, albuterol inhaler p.r.n..   SOCIAL HISTORY:  She is a Consulting civil engineer at [employer].  Denies sexual activity.  No  tobacco or alcohol use.   FAMILY HISTORY:  Positive for hypertension and coronary artery disease.   REVIEW OF SYMPTOMS:  CARDIOVASCULAR:  No hypertension or heart disease.  PULMONARY:  She has no pneumonia or tuberculosis.  GI:  No peptic ulcer  disease, hepatitis or colitis.  GU:  Denies kidney stones.  ENDOCRINE:  No  diabetes.  HEMATOLOGIC:  Denies bleeding disorders, blood transfusions or  deep venous thrombosis.  GYN:  Last menstrual period 06/01/2002.   PHYSICAL EXAMINATION:  GENERAL:  Well-developed, well-nourished female in  no  acute distress, very pleasant and cooperative.  EYES:  Extraocular motions intact and sclerae clear.  NECK:  Supple without masses.  CARDIOVASCULAR:  Heart demonstrates a regular rate and rhythm with no  murmur.  RESPIRATORY:  Breath sounds equal and clear.  Respirations normal.  ABDOMEN:  Soft with right lower quadrant tenderness and guarding and a  positive psoas sign.  Hypoactive bowel sounds noted.  No masses palpated.  EXTREMITIES:  Full range of motion.  No cyanosis or edema.   LABORATORY DATA:  Urinalysis shows 0-2 white blood cells and rare bacteria.  CMET normal.  Urine pregnancy negative.  White cell count 14,900, hemoglobin  13.6.   CT was examined and demonstrates a dilated appendix.   IMPRESSION:  Acute appendicitis.   PLAN:  Laparoscopic appendectomy.  The procedure and risks including but not  limited to bleeding, infection, anesthetic risk, accidental damage  to intra-  abdominal organs (such as small intestine, large intestine, bladder, etc.)  were explained to her and her mother.  They seemed to understand and agreed  to proceed.                                               Adolph Pollack, M.D.    Kari Baars  D:  06/18/2002  T:  06/22/2002  Job:  [id number]

## 2011-03-27 NOTE — Procedures (Signed)
[place]. [facility]  Patient:    [name], [name]                 MRN: [id number] Proc. Date: 01/15/00 Adm. Date:  [id number] Disc. Date: [id number] Attending:  Nathen May CC:         Harlan Stains, M.D.             at [facility]             Hassie Bruce, M.D.             [address]             [place], [place] [address]                           Procedure Report  PROCEDURE:  Tilt table testing without isoproterenol drug infusion per protocol of Dr. Hassie Bruce.  DESCRIPTION OF PROCEDURE:  The patient was equilibrated in the supine position.  The patient was then tilted upright for 70 degrees.  The patients heart rate and blood pressure remained relatively stable in the 105 to 115 range with heart rates of 75 to 95 for the first 24 minutes of the procedure.  At minute 25, there was initially an increase the heart rate from the mid-90s to 105 and there was thereafter an abrupt decreased in the heart rate from 106 to  nadir of 56 and a decrease in the blood pressure from 106 to 73.  This was associated with pupillary dilatation, headaches, diaphoresis, and pallor.  She was returned to the supine position, the symptoms abated, and her hemodynamics recovered.  IMPRESSION:  Positive tilt table test consistent with neural cardiogenic syncope with both vasodepressor and cardioinhibitory manifestations.  RECOMMENDATIONS:  Recommendations per Dr. Silvana Newness. DD:  01/21/00 TD:  01/21/00 Job: [id number]

## 2011-04-08 ENCOUNTER — Inpatient Hospital Stay (INDEPENDENT_AMBULATORY_CARE_PROVIDER_SITE_OTHER)
Admission: RE | Admit: 2011-04-08 | Discharge: 2011-04-08 | Disposition: A | Discharge status: Home / Self Care | Payer: 59 | Source: Ambulatory Visit | Attending: Family Medicine | Admitting: Family Medicine

## 2011-04-08 DIAGNOSIS — M722 Plantar fascial fibromatosis: Secondary | ICD-10-CM

## 2011-04-21 ENCOUNTER — Emergency Department (HOSPITAL_COMMUNITY)
Admission: EM | Admit: 2011-04-21 | Discharge: 2011-04-22 | Disposition: A | Discharge status: Home / Self Care | Payer: 59 | Attending: Emergency Medicine | Admitting: Emergency Medicine

## 2011-04-21 DIAGNOSIS — R110 Nausea: Secondary | ICD-10-CM | POA: Insufficient documentation

## 2011-04-21 DIAGNOSIS — R079 Chest pain, unspecified: Secondary | ICD-10-CM | POA: Insufficient documentation

## 2011-04-21 DIAGNOSIS — R1012 Left upper quadrant pain: Secondary | ICD-10-CM | POA: Insufficient documentation

## 2011-04-21 DIAGNOSIS — J45909 Unspecified asthma, uncomplicated: Secondary | ICD-10-CM | POA: Insufficient documentation

## 2011-04-21 LAB — POCT PREGNANCY, URINE: Preg Test, Ur: NEGATIVE

## 2011-04-22 ENCOUNTER — Emergency Department (HOSPITAL_COMMUNITY): Payer: 59

## 2011-04-22 LAB — COMPREHENSIVE METABOLIC PANEL
ALT: 13 U/L (ref 0–35)
Albumin: 3.7 g/dL (ref 3.5–5.2)
Alkaline Phosphatase: 60 U/L (ref 39–117)
Glucose, Bld: 102 mg/dL — ABNORMAL HIGH (ref 70–99)
Potassium: 3.9 mEq/L (ref 3.5–5.1)
Sodium: 139 mEq/L (ref 135–145)
Total Protein: 7.3 g/dL (ref 6.0–8.3)

## 2011-04-22 LAB — DIFFERENTIAL
Basophils Absolute: 0 10*3/uL (ref 0.0–0.1)
Eosinophils Relative: 2 % (ref 0–5)
Lymphocytes Relative: 32 % (ref 12–46)
Neutro Abs: 6.5 10*3/uL (ref 1.7–7.7)
Neutrophils Relative %: 59 % (ref 43–77)

## 2011-04-22 LAB — URINALYSIS, ROUTINE W REFLEX MICROSCOPIC
Bilirubin Urine: NEGATIVE
Glucose, UA: NEGATIVE mg/dL
Hgb urine dipstick: NEGATIVE
Ketones, ur: NEGATIVE mg/dL
Leukocytes, UA: NEGATIVE
Nitrite: NEGATIVE
Protein, ur: NEGATIVE mg/dL
Specific Gravity, Urine: 1.025 (ref 1.005–1.030)
Urobilinogen, UA: 1 mg/dL (ref 0.0–1.0)
pH: 6.5 (ref 5.0–8.0)

## 2011-04-22 LAB — CBC
HCT: 39.7 % (ref 36.0–46.0)
RDW: 12.5 % (ref 11.5–15.5)
WBC: 11 10*3/uL — ABNORMAL HIGH (ref 4.0–10.5)

## 2011-04-22 MED ORDER — IOHEXOL 300 MG/ML  SOLN
100.0000 mL | Freq: Once | INTRAMUSCULAR | Status: AC | PRN
  Administered 2011-04-22: 100 mL via INTRAVENOUS

## 2011-04-23 ENCOUNTER — Emergency Department (HOSPITAL_COMMUNITY): Payer: 59

## 2011-04-23 ENCOUNTER — Inpatient Hospital Stay (INDEPENDENT_AMBULATORY_CARE_PROVIDER_SITE_OTHER)
Admission: RE | Admit: 2011-04-23 | Discharge: 2011-04-23 | Disposition: A | Discharge status: Home / Self Care | Payer: 59 | Source: Ambulatory Visit | Attending: Family Medicine | Admitting: Family Medicine

## 2011-04-23 ENCOUNTER — Emergency Department (HOSPITAL_COMMUNITY)
Admission: EM | Admit: 2011-04-23 | Discharge: 2011-04-23 | Disposition: A | Discharge status: Home / Self Care | Payer: 59 | Attending: Emergency Medicine | Admitting: Emergency Medicine

## 2011-04-23 DIAGNOSIS — R0609 Other forms of dyspnea: Secondary | ICD-10-CM | POA: Insufficient documentation

## 2011-04-23 DIAGNOSIS — IMO0001 Reserved for inherently not codable concepts without codable children: Secondary | ICD-10-CM | POA: Insufficient documentation

## 2011-04-23 DIAGNOSIS — R799 Abnormal finding of blood chemistry, unspecified: Secondary | ICD-10-CM | POA: Insufficient documentation

## 2011-04-23 DIAGNOSIS — R079 Chest pain, unspecified: Secondary | ICD-10-CM | POA: Insufficient documentation

## 2011-04-23 DIAGNOSIS — Z79899 Other long term (current) drug therapy: Secondary | ICD-10-CM | POA: Insufficient documentation

## 2011-04-23 DIAGNOSIS — R0989 Other specified symptoms and signs involving the circulatory and respiratory systems: Secondary | ICD-10-CM | POA: Insufficient documentation

## 2011-04-23 DIAGNOSIS — J45909 Unspecified asthma, uncomplicated: Secondary | ICD-10-CM | POA: Insufficient documentation

## 2011-04-23 DIAGNOSIS — R10812 Left upper quadrant abdominal tenderness: Secondary | ICD-10-CM

## 2011-04-23 LAB — CBC
HCT: 40.8 % (ref 36.0–46.0)
Hemoglobin: 14.4 g/dL (ref 12.0–15.0)
MCH: 30.9 pg (ref 26.0–34.0)
MCHC: 35.3 g/dL (ref 30.0–36.0)
MCV: 87.6 fL (ref 78.0–100.0)

## 2011-04-23 LAB — BASIC METABOLIC PANEL
Chloride: 101 mEq/L (ref 96–112)
GFR calc Af Amer: >60 mL/min (ref >60)
GFR calc non Af Amer: >60 mL/min (ref >60)
Potassium: 3.5 mEq/L (ref 3.5–5.1)

## 2011-04-23 LAB — CK TOTAL AND CKMB (NOT AT ARMC)
CK, MB: 1.3 ng/mL (ref 0.3–4.0)
Relative Index: INVALID (ref 0.0–2.5)

## 2011-04-23 LAB — TROPONIN I: Troponin I: <0.3 ng/mL (ref <0.30)

## 2011-07-11 ENCOUNTER — Encounter (HOSPITAL_COMMUNITY): Payer: Self-pay | Admitting: *Deleted

## 2011-07-11 ENCOUNTER — Inpatient Hospital Stay (HOSPITAL_COMMUNITY)
Admission: AD | Admit: 2011-07-11 | Discharge: 2011-07-11 | Disposition: A | Discharge status: Home / Self Care | Payer: 59 | Source: Ambulatory Visit | Attending: Obstetrics & Gynecology | Admitting: Obstetrics & Gynecology

## 2011-07-11 DIAGNOSIS — N926 Irregular menstruation, unspecified: Secondary | ICD-10-CM

## 2011-07-11 DIAGNOSIS — N912 Amenorrhea, unspecified: Secondary | ICD-10-CM

## 2011-07-11 DIAGNOSIS — N939 Abnormal uterine and vaginal bleeding, unspecified: Secondary | ICD-10-CM | POA: Insufficient documentation

## 2011-07-11 DIAGNOSIS — IMO0001 Reserved for inherently not codable concepts without codable children: Secondary | ICD-10-CM | POA: Insufficient documentation

## 2011-07-11 DIAGNOSIS — J45909 Unspecified asthma, uncomplicated: Secondary | ICD-10-CM | POA: Insufficient documentation

## 2011-07-11 HISTORY — DX: Fibromyalgia: M79.7

## 2011-07-11 LAB — URINALYSIS, ROUTINE W REFLEX MICROSCOPIC
Bilirubin Urine: NEGATIVE
Ketones, ur: NEGATIVE mg/dL
Leukocytes, UA: NEGATIVE
Nitrite: NEGATIVE
Specific Gravity, Urine: >1.03 — ABNORMAL HIGH (ref 1.005–1.030)
Urobilinogen, UA: 0.2 mg/dL (ref 0.0–1.0)
pH: 5.5 (ref 5.0–8.0)

## 2011-07-11 MED ORDER — MEDROXYPROGESTERONE ACETATE 10 MG PO TABS: 10.0000 mg | ORAL_TABLET | Freq: Every day | ORAL | Status: DC

## 2011-07-11 NOTE — ED Provider Notes (Signed)
Attestation of Attending Supervision of Advanced Practitioner: Evaluation and management procedures were performed by the PA/NP/CNM/OB Fellow under my supervision/collaboration. Chart reviewed and agree with management and plan.  ANYANWU,UGONNA A 07/11/2011 11:26 PM

## 2011-07-11 NOTE — Progress Notes (Signed)
Patient reports 11 days late for period feels like is going to start but doesn't, negative pregnancy test at home

## 2011-07-11 NOTE — ED Provider Notes (Signed)
History     No chief complaint on file.  HPI Presents with c/o 11 day late period.   States periods have NEVER been late.  Had Neg UPT at home. Wants to know why her period is late.Does not have GYN MD>   Past Medical History  Diagnosis Date  . Asthma   . Fibromyalgia muscle pain     Past Surgical History  Procedure Date  . Breast reduction surgery   . Appendectomy   . Cholecystectomy     No family history on file.  History  Substance Use Topics  . Smoking status: Former Smoker    Quit date: 11/09/2008  . Smokeless tobacco: Not on file  . Alcohol Use: No    Allergies:  Allergies  Allergen Reactions  . Sulfa Antibiotics Swelling    Prescriptions prior to admission  Medication Sig Dispense Refill  . albuterol (PROVENTIL HFA;VENTOLIN HFA) 108 (90 BASE) MCG/ACT inhaler Inhale 2 puffs into the lungs every 6 (six) hours as needed. For shortness of breath       . aspirin-acetaminophen-caffeine (EXCEDRIN MIGRAINE) 250-250-65 MG per tablet Take 1 tablet by mouth every 6 (six) hours as needed. For pain       . naproxen sodium (ANAPROX) 220 MG tablet Take 220 mg by mouth 2 (two) times daily with a meal. For pain         Review of Systems  Constitutional: Negative for fever and malaise/fatigue.  Gastrointestinal: Negative for abdominal pain.  Genitourinary: Negative for dysuria.    Physical Exam   Blood pressure 138/91, pulse 101, temperature 98.9 F (37.2 C), temperature source Oral, height 5\' 4"  (1.626 m), weight 232 lb (105.235 kg), last menstrual period 06/04/2011.  Physical Exam  Constitutional: She is oriented to person, place, and time. She appears well-developed and well-nourished.  HENT:  Head: Normocephalic.  Cardiovascular: Normal rate.   Respiratory: Effort normal.  GI: Soft. She exhibits no distension. There is no tenderness. There is no rebound and no guarding.  Genitourinary: Vagina normal and uterus normal. No vaginal discharge found.  Neurological:  She is alert and oriented to person, place, and time.  Skin: Skin is warm and dry.  Psychiatric: She has a normal mood and affect.  Uterus small, nontender, no masses   MAU Course  Procedures   Assessment and Plan  A:  Late Menses Not Pregnant  P:  Reassured probably related to either anovulation or poor egg quality this month Reviewed expectant mgmt vs Provera Challenge Pt opts to do Provera >> Rx provided If no withdrawal bleed, pt is to notify her MD and have further testing  [facility] 07/11/2011, 4:23 PM

## 2011-08-06 LAB — POCT I-STAT, CHEM 8
Chloride: 107
HCT: 43
Hemoglobin: 14.6
Potassium: 4.1
Sodium: 139

## 2011-08-06 LAB — COMPREHENSIVE METABOLIC PANEL
ALT: 17
AST: 18
Albumin: 4
CO2: 27
Chloride: 106
Creatinine, Ser: 0.83
GFR calc Af Amer: >60
Potassium: 3.6
Sodium: 141
Total Bilirubin: 0.8

## 2011-08-06 LAB — URINALYSIS, ROUTINE W REFLEX MICROSCOPIC
Bilirubin Urine: NEGATIVE
Glucose, UA: NEGATIVE
Hgb urine dipstick: NEGATIVE
Hgb urine dipstick: NEGATIVE
Ketones, ur: NEGATIVE
Protein, ur: NEGATIVE
Specific Gravity, Urine: 1.031 — ABNORMAL HIGH
Urobilinogen, UA: 0.2
Urobilinogen, UA: 0.2
pH: 5.5

## 2011-08-06 LAB — CBC
Platelets: 385
RBC: 5.05
WBC: 10.4

## 2011-08-06 LAB — URINE MICROSCOPIC-ADD ON

## 2011-08-06 LAB — DIFFERENTIAL
Basophils Absolute: 0.1
Eosinophils Absolute: 0.1
Eosinophils Relative: 1
Lymphocytes Relative: 33
Monocytes Absolute: 0.3

## 2011-08-06 LAB — POCT PREGNANCY, URINE
Operator id: 264421
Preg Test, Ur: NEGATIVE
Preg Test, Ur: NEGATIVE

## 2011-08-11 LAB — DIFFERENTIAL
Lymphocytes Relative: 41
Lymphs Abs: 2.7
Neutro Abs: 3.4
Neutrophils Relative %: 51

## 2011-08-11 LAB — COMPREHENSIVE METABOLIC PANEL
CO2: 24
Calcium: 9.1
Creatinine, Ser: 0.67
GFR calc non Af Amer: >60
Glucose, Bld: 111 — ABNORMAL HIGH

## 2011-08-11 LAB — GLUCOSE, CAPILLARY: Glucose-Capillary: 101 — ABNORMAL HIGH

## 2011-08-11 LAB — CBC
Hemoglobin: 13.2
MCHC: 33.7
MCV: 88.3
RBC: 4.43

## 2011-08-11 LAB — PREGNANCY, URINE: Preg Test, Ur: NEGATIVE

## 2011-08-18 LAB — STREP A DNA PROBE: Group A Strep Probe: NEGATIVE

## 2011-08-19 LAB — POCT I-STAT CREATININE: Operator id: 235561

## 2011-08-19 LAB — I-STAT 8, (EC8 V) (CONVERTED LAB)
Acid-base deficit: 1
Bicarbonate: 25.4 — ABNORMAL HIGH
Glucose, Bld: 93
HCT: 45
Hemoglobin: 15.3 — ABNORMAL HIGH
Operator id: 235561
Potassium: 4
Sodium: 139
TCO2: 27

## 2011-08-19 LAB — CBC
Hemoglobin: 14.1
MCHC: 33.9
Platelets: 322
RDW: 14.3 — ABNORMAL HIGH

## 2011-08-19 LAB — DIFFERENTIAL
Basophils Absolute: 0
Basophils Relative: 0
Eosinophils Relative: 2
Monocytes Absolute: 0.4
Neutro Abs: 6.2

## 2011-08-31 ENCOUNTER — Emergency Department (HOSPITAL_COMMUNITY)
Admission: EM | Admit: 2011-08-31 | Discharge: 2011-08-31 | Disposition: A | Discharge status: Home / Self Care | Payer: 59 | Attending: Emergency Medicine | Admitting: Emergency Medicine

## 2011-08-31 DIAGNOSIS — J029 Acute pharyngitis, unspecified: Secondary | ICD-10-CM | POA: Insufficient documentation

## 2011-08-31 DIAGNOSIS — J069 Acute upper respiratory infection, unspecified: Secondary | ICD-10-CM | POA: Insufficient documentation

## 2011-08-31 LAB — RAPID STREP SCREEN (MED CTR MEBANE ONLY): Streptococcus, Group A Screen (Direct): NEGATIVE

## 2011-09-26 ENCOUNTER — Encounter (HOSPITAL_COMMUNITY): Payer: Self-pay

## 2011-09-26 ENCOUNTER — Emergency Department (HOSPITAL_COMMUNITY)
Admission: EM | Admit: 2011-09-26 | Discharge: 2011-09-26 | Disposition: A | Discharge status: Home / Self Care | Payer: 59 | Attending: Emergency Medicine | Admitting: Emergency Medicine

## 2011-09-26 DIAGNOSIS — J069 Acute upper respiratory infection, unspecified: Secondary | ICD-10-CM

## 2011-09-26 DIAGNOSIS — R000 Tachycardia, unspecified: Secondary | ICD-10-CM | POA: Insufficient documentation

## 2011-09-26 DIAGNOSIS — IMO0001 Reserved for inherently not codable concepts without codable children: Secondary | ICD-10-CM | POA: Insufficient documentation

## 2011-09-26 DIAGNOSIS — R509 Fever, unspecified: Secondary | ICD-10-CM | POA: Insufficient documentation

## 2011-09-26 DIAGNOSIS — R112 Nausea with vomiting, unspecified: Secondary | ICD-10-CM | POA: Insufficient documentation

## 2011-09-26 DIAGNOSIS — O99891 Other specified diseases and conditions complicating pregnancy: Secondary | ICD-10-CM | POA: Insufficient documentation

## 2011-09-26 LAB — RAPID STREP SCREEN (MED CTR MEBANE ONLY): Streptococcus, Group A Screen (Direct): NEGATIVE

## 2011-09-26 MED ORDER — ONDANSETRON 8 MG PO TBDP
8.0000 mg | ORAL_TABLET | Freq: Once | ORAL | Status: AC
  Administered 2011-09-26: 8 mg via ORAL
  Filled 2011-09-26 (×2): qty 1

## 2011-09-26 MED ORDER — ACETAMINOPHEN 325 MG PO TABS
ORAL_TABLET | ORAL | Status: AC
  Filled 2011-09-26: qty 2

## 2011-09-26 MED ORDER — ACETAMINOPHEN 325 MG PO TABS
650.0000 mg | ORAL_TABLET | Freq: Once | ORAL | Status: AC
  Administered 2011-09-26: 650 mg via ORAL

## 2011-09-26 MED ORDER — ONDANSETRON HCL 4 MG PO TABS: 4.0000 mg | ORAL_TABLET | Freq: Four times a day (QID) | ORAL | Status: AC

## 2011-09-26 NOTE — ED Provider Notes (Signed)
History     CSN: [id number] Arrival date & time: 09/26/2011  7:46 PM   First MD Initiated Contact with Patient 09/26/11 1956      Chief Complaint  Patient presents with  . Fever  . Chills  . Generalized Body Aches  . Nasal Congestion    (Consider location/radiation/quality/duration/timing/severity/associated sxs/prior treatment) Patient is a 30 y.o. female presenting with fever. The history is provided by the patient. No language interpreter was used.  Fever Primary symptoms of the febrile illness include fever, fatigue, nausea, vomiting and myalgias. Primary symptoms do not include cough, wheezing, shortness of breath, abdominal pain or diarrhea. This is a new problem. Primary symptoms comment: sore throat    Past Medical History  Diagnosis Date  . Asthma   . Fibromyalgia muscle pain     Past Surgical History  Procedure Date  . Breast reduction surgery   . Appendectomy   . Cholecystectomy     No family history on file.  History  Substance Use Topics  . Smoking status: Former Smoker    Quit date: 11/09/2008  . Smokeless tobacco: Not on file  . Alcohol Use: No    OB History    Grav Para Term Preterm Abortions TAB SAB Ect Mult Living   2 1 1  0 0 0 0 0 0 1      Review of Systems  Constitutional: Positive for fever and fatigue.  Respiratory: Negative for cough, shortness of breath and wheezing.   Gastrointestinal: Positive for nausea and vomiting. Negative for abdominal pain and diarrhea.  Musculoskeletal: Positive for myalgias.  All other systems reviewed and are negative.    Allergies  Sulfa antibiotics  Home Medications   Current Outpatient Rx  Name Route Sig Dispense Refill  . LORATADINE 10 MG PO TABS Oral Take 10 mg by mouth as needed.      . MULTIVITAMINS PO CAPS Oral Take 1 capsule by mouth daily.      . ALBUTEROL SULFATE HFA 108 (90 BASE) MCG/ACT IN AERS Inhalation Inhale 2 puffs into the lungs every 6 (six) hours as needed. For shortness of  breath     . ASPIRIN-ACETAMINOPHEN-CAFFEINE 250-250-65 MG PO TABS Oral Take 1 tablet by mouth every 6 (six) hours as needed. For pain     . MEDROXYPROGESTERONE ACETATE 10 MG PO TABS Oral Take 1 tablet (10 mg total) by mouth daily. 5 tablet 0  . NAPROXEN SODIUM 220 MG PO TABS Oral Take 220 mg by mouth 2 (two) times daily with a meal. For pain       BP 110/68  Pulse 122  Temp(Src) 99.2 F (37.3 C) (Oral)  Resp 22  SpO2 99%  LMP 08/12/2011  Physical Exam  Nursing note and vitals reviewed. Constitutional: She is oriented to person, place, and time. She appears well-developed and well-nourished.  Eyes: Pupils are equal, round, and reactive to light.  Neck: Normal range of motion. Neck supple. No JVD present.  Cardiovascular:       Tachycardia   Pulmonary/Chest: Effort normal. No respiratory distress. She has no wheezes. She has no rales. She exhibits no tenderness.  Abdominal: Soft. Bowel sounds are normal.  Neurological: She is alert and oriented to person, place, and time.  Skin: Skin is warm and dry.  Psychiatric: She has a normal mood and affect.    ED Course  Procedures (including critical care time)   Labs Reviewed  RAPID STREP SCREEN   No results found.   No diagnosis found.  MDM   Sore throat x 1 day and nasal congestion x 4 days.   [redacted] weeks pregnant. Strep screen negative.  Sick contacts in the home.  Low grade fever and body aches. Zofran for nausea.  (vomited x 1 at home)  Will take tylenol every 4 hours for pain with warm salt water gargle.  Follow up on Monday with OB GYN.  Return if worse.       Jethro Bastos, NP 09/27/11 1017

## 2011-09-26 NOTE — ED Notes (Signed)
Pt. C/o nasal congestion, chills, fever, nausea with an episode of vomiting, sore throat, generalized bodyaches.  Pt. Is also approximately six weeks pregnant.

## 2011-09-28 NOTE — ED Provider Notes (Signed)
Medical screening examination/treatment/procedure(s) were performed by non-physician practitioner and as supervising physician I was immediately available for consultation/collaboration.   Gyanna Jarema, MD 09/28/11 0019 

## 2011-10-20 LAB — OB RESULTS CONSOLE GC/CHLAMYDIA
Chlamydia: NEGATIVE
Gonorrhea: NEGATIVE

## 2011-10-20 LAB — OB RESULTS CONSOLE ANTIBODY SCREEN: Antibody Screen: NEGATIVE

## 2011-10-20 LAB — OB RESULTS CONSOLE RPR: RPR: NONREACTIVE

## 2011-10-20 LAB — OB RESULTS CONSOLE RUBELLA ANTIBODY, IGM: Rubella: IMMUNE

## 2011-10-20 LAB — OB RESULTS CONSOLE HEPATITIS B SURFACE ANTIGEN: Hepatitis B Surface Ag: NEGATIVE

## 2011-11-10 NOTE — L&D Delivery Note (Signed)
Delivery Note Pt with GBBS + received PCN q 4, some tachycardia to 160's.  Pushed very well for 10 minutes.  At 1:28 AM a viable female was delivered via Vaginal, Spontaneous Delivery (Presentation: ; Occiput Anterior to LOT).  APGAR: 8, 9; weight P.   Placenta status: Intact, Spontaneous.  Cord: 3 vessels with the following complications: None.   Anesthesia: Epidural  Episiotomy: None Lacerations: 1st degree;Perineal Suture Repair: 3.0 vicryl rapide Est. Blood Loss (mL): 500  Mom to postpartum.  Baby to with [facility] mommy.  BOVARD,Ares Tegtmeyer 05/17/2012, 1:53 AM  Br/Bo; A+; Contra?

## 2011-11-12 ENCOUNTER — Inpatient Hospital Stay (HOSPITAL_COMMUNITY)
Admission: AD | Admit: 2011-11-12 | Discharge: 2011-11-12 | Disposition: A | Discharge status: Home / Self Care | Payer: 59 | Source: Ambulatory Visit | Attending: Obstetrics and Gynecology | Admitting: Obstetrics and Gynecology

## 2011-11-12 ENCOUNTER — Encounter (HOSPITAL_COMMUNITY): Payer: Self-pay

## 2011-11-12 DIAGNOSIS — O26859 Spotting complicating pregnancy, unspecified trimester: Secondary | ICD-10-CM

## 2011-11-12 DIAGNOSIS — R109 Unspecified abdominal pain: Secondary | ICD-10-CM | POA: Insufficient documentation

## 2011-11-12 HISTORY — DX: Fibromyalgia: M79.7

## 2011-11-12 LAB — URINE MICROSCOPIC-ADD ON

## 2011-11-12 LAB — URINALYSIS, ROUTINE W REFLEX MICROSCOPIC
Bilirubin Urine: NEGATIVE
Ketones, ur: NEGATIVE mg/dL
Specific Gravity, Urine: >1.03 — ABNORMAL HIGH (ref 1.005–1.030)
pH: 6 (ref 5.0–8.0)

## 2011-11-12 NOTE — Progress Notes (Signed)
Spotting brownish x 3 days, lower abd pain. Seen in office today, u/s showed FHR. Was told to come in if she started to have bright red bleeding. This pm started having bright red blood on tissue. Did not wear a pad in.

## 2011-11-12 NOTE — Progress Notes (Signed)
Patient states she has been having spotting and lower abdominal cramping off and on for 3 days. Was seen in the office this am and had an ultrasound to confirm heart rate. States she has lower abdominal pain when she breathes. Patient is not wearing a pad at this time.

## 2011-11-12 NOTE — ED Provider Notes (Signed)
History     Chief Complaint  Patient presents with  . Abdominal Pain   HPI 31 y.o. G2P1001 at [redacted]w[redacted]d with vaginal bleeding and low abd pain x 3 days. Seen in office today d/t bleeding and had u/s confirming FHR. Bleeding was brown earlier, now red, only notices with wiping.     Past Medical History  Diagnosis Date  . Asthma   . Fibromyalgia muscle pain   . Fibromyalgia     Past Surgical History  Procedure Date  . Breast reduction surgery   . Appendectomy   . Cholecystectomy     Family History  Problem Relation Age of Onset  . Hypertension Mother   . Hypertension Son   . Heart disease Paternal Uncle   . Hypertension Maternal Grandmother     History  Substance Use Topics  . Smoking status: Former Smoker    Quit date: 11/09/2008  . Smokeless tobacco: Not on file  . Alcohol Use: No    Allergies:  Allergies  Allergen Reactions  . Sulfa Antibiotics Swelling    Prescriptions prior to admission  Medication Sig Dispense Refill  . acetaminophen (TYLENOL) 325 MG tablet Take 650 mg by mouth every 6 (six) hours as needed. Head aches       . albuterol (PROVENTIL HFA;VENTOLIN HFA) 108 (90 BASE) MCG/ACT inhaler Inhale 2 puffs into the lungs every 6 (six) hours as needed. For shortness of breath       . loratadine (CLARITIN) 10 MG tablet Take 10 mg by mouth as needed. allergies      . Multiple Vitamin (MULTIVITAMIN) capsule Take 1 capsule by mouth daily.        Marland Kitchen aspirin-acetaminophen-caffeine (EXCEDRIN MIGRAINE) 250-250-65 MG per tablet Take 1 tablet by mouth every 6 (six) hours as needed. For pain       . multivitamin-iron-minerals-folic acid (CENTRUM) chewable tablet Chew 1 tablet by mouth daily.          Review of Systems  Constitutional: Negative.   Respiratory: Negative.   Cardiovascular: Negative.   Gastrointestinal: Positive for abdominal pain. Negative for nausea, vomiting, diarrhea and constipation.  Genitourinary: Negative for dysuria, urgency, frequency,  hematuria and flank pain.       Positive for vaginal bleeding   Musculoskeletal: Negative.   Neurological: Negative.   Psychiatric/Behavioral: Negative.    Physical Exam   Blood pressure 124/74, pulse 117, temperature 99 F (37.2 C), temperature source Oral, resp. rate 20, height 5' 4.5" (1.638 m), weight 245 lb 6.4 oz (111.313 kg), last menstrual period 08/12/2011, SpO2 99.00%.  Physical Exam  Nursing note and vitals reviewed. Constitutional: She is oriented to person, place, and time. She appears well-developed and well-nourished. No distress.  HENT:  Head: Normocephalic and atraumatic.  Cardiovascular: Normal rate.   Respiratory: Effort normal.  GI: Soft. Bowel sounds are normal. She exhibits no mass. There is no tenderness. There is no rebound and no guarding.  Genitourinary: There is no rash or lesion on the right labia. There is no rash or lesion on the left labia. Uterus is not tender. Enlarged: Size c/w dates. Cervix exhibits no motion tenderness, no discharge and no friability. Right adnexum displays no mass, no tenderness and no fullness. Left adnexum displays no mass, no tenderness and no fullness. No tenderness or bleeding (no evidence of bleeding) around the vagina. No vaginal discharge found.  Musculoskeletal: Normal range of motion.  Neurological: She is alert and oriented to person, place, and time.  Skin: Skin is warm  and dry.  Psychiatric: She has a normal mood and affect.   MAU Course  Procedures  Bedside u/s: + FHR and movement  Assessment and Plan  30 y.o. G2P1001 at [redacted]w[redacted]d Spotting - + fhr, no active bleeding F/U in office as scheduled  Ezrah Dembeck 11/12/2011, 9:01 PM

## 2011-11-13 ENCOUNTER — Encounter (HOSPITAL_COMMUNITY): Payer: Self-pay | Admitting: Advanced Practice Midwife

## 2011-12-09 ENCOUNTER — Other Ambulatory Visit: Payer: Self-pay

## 2012-01-20 ENCOUNTER — Other Ambulatory Visit (HOSPITAL_COMMUNITY): Payer: Self-pay | Admitting: Obstetrics and Gynecology

## 2012-01-20 DIAGNOSIS — O2690 Pregnancy related conditions, unspecified, unspecified trimester: Secondary | ICD-10-CM

## 2012-01-20 DIAGNOSIS — Z3689 Encounter for other specified antenatal screening: Secondary | ICD-10-CM

## 2012-02-02 ENCOUNTER — Ambulatory Visit (HOSPITAL_COMMUNITY)
Admission: RE | Admit: 2012-02-02 | Discharge: 2012-02-02 | Disposition: A | Discharge status: Home / Self Care | Payer: Medicaid Other | Source: Ambulatory Visit | Attending: Obstetrics and Gynecology | Admitting: Obstetrics and Gynecology

## 2012-02-02 VITALS — BP 113/64 | HR 102 | Wt 252.0 lb

## 2012-02-02 DIAGNOSIS — O358XX0 Maternal care for other (suspected) fetal abnormality and damage, not applicable or unspecified: Secondary | ICD-10-CM | POA: Insufficient documentation

## 2012-02-02 DIAGNOSIS — E669 Obesity, unspecified: Secondary | ICD-10-CM | POA: Insufficient documentation

## 2012-02-02 DIAGNOSIS — Z3689 Encounter for other specified antenatal screening: Secondary | ICD-10-CM

## 2012-02-02 DIAGNOSIS — Z363 Encounter for antenatal screening for malformations: Secondary | ICD-10-CM | POA: Insufficient documentation

## 2012-02-02 DIAGNOSIS — O99214 Obesity complicating childbirth: Secondary | ICD-10-CM

## 2012-02-02 DIAGNOSIS — Z1389 Encounter for screening for other disorder: Secondary | ICD-10-CM | POA: Insufficient documentation

## 2012-02-02 DIAGNOSIS — O2690 Pregnancy related conditions, unspecified, unspecified trimester: Secondary | ICD-10-CM

## 2012-02-02 NOTE — Progress Notes (Signed)
Obstetric ultrasound performed today.   IUP at 24 weeks 6 days Measurements consistent with dating by LMP Normal amniotic fluid volume Normal fetal anatomic survey (some limited head, cardiac and spine views) No markers of aneuploidy identified   Please see [facility] for full report.

## 2012-02-23 ENCOUNTER — Inpatient Hospital Stay (HOSPITAL_COMMUNITY)
Admission: AD | Admit: 2012-02-23 | Discharge: 2012-02-23 | Disposition: A | Discharge status: Home / Self Care | Payer: 59 | Source: Ambulatory Visit | Attending: Obstetrics and Gynecology | Admitting: Obstetrics and Gynecology

## 2012-02-23 ENCOUNTER — Inpatient Hospital Stay (HOSPITAL_COMMUNITY): Payer: 59

## 2012-02-23 ENCOUNTER — Encounter (HOSPITAL_COMMUNITY): Payer: Self-pay | Admitting: *Deleted

## 2012-02-23 DIAGNOSIS — O99891 Other specified diseases and conditions complicating pregnancy: Secondary | ICD-10-CM | POA: Insufficient documentation

## 2012-02-23 DIAGNOSIS — O368190 Decreased fetal movements, unspecified trimester, not applicable or unspecified: Secondary | ICD-10-CM

## 2012-02-23 NOTE — MAU Note (Signed)
T. Burrleson NP in to see pt. 

## 2012-02-23 NOTE — MAU Note (Signed)
PT SAYS   ON Monday - SHE AWOKE -  AND  FELT GUSH OF SOMETHING  COME THROUGH HER VAGINA- SAW NOTHING.  FELT THIS AGAIN  IN AFTERNOON.      THEN TODAY- THIS HAPPENED X2 THIS AFTERNOON- CALLED OFFICE- TOLD   TO COME IN.   TODAY HER  PANTIES WERE WET- ONCE TOILET PAPER WAS WET.Marland Kitchen  DOES NOT  FEEL BABY MOVE AS MUCH AS NORMAL- AND WHEN DOES - NOT  VERY STRONG.

## 2012-02-23 NOTE — MAU Provider Note (Signed)
History     CSN: [id number]  Arrival date and time: 02/23/12 2021   First Provider Initiated Contact with Patient 02/23/12 2055      No chief complaint on file.  HPI [name] 31 y.o. [redacted]w[redacted]d  Noticed leaking of fluid twice yesterday and twice today.  Also noticing fetal movements are less and not as strong to her.  Reports she was instructed by the office to come to MAU.  OB History    Grav Para Term Preterm Abortions TAB SAB Ect Mult Living   2 1 1  0 0 0 0 0 0 1      Past Medical History  Diagnosis Date  . Asthma   . Fibromyalgia muscle pain   . Fibromyalgia     Past Surgical History  Procedure Date  . Breast reduction surgery   . Appendectomy   . Cholecystectomy     Family History  Problem Relation Age of Onset  . Hypertension Mother   . Hypertension Son   . Heart disease Paternal Uncle   . Hypertension Maternal Grandmother     History  Substance Use Topics  . Smoking status: Former Smoker    Quit date: 11/09/2008  . Smokeless tobacco: Not on file  . Alcohol Use: No    Allergies:  Allergies  Allergen Reactions  . Sulfa Antibiotics Swelling    Prescriptions prior to admission  Medication Sig Dispense Refill  . acetaminophen (TYLENOL) 325 MG tablet Take 650 mg by mouth every 6 (six) hours as needed. Head aches       . albuterol (PROVENTIL HFA;VENTOLIN HFA) 108 (90 BASE) MCG/ACT inhaler Inhale 2 puffs into the lungs every 6 (six) hours as needed. For shortness of breath       . aspirin-acetaminophen-caffeine (EXCEDRIN MIGRAINE) 250-250-65 MG per tablet Take 1 tablet by mouth every 6 (six) hours as needed. For pain       . loratadine (CLARITIN) 10 MG tablet Take 10 mg by mouth as needed. allergies      . Multiple Vitamin (MULTIVITAMIN) capsule Take 1 capsule by mouth daily.        . multivitamin-iron-minerals-folic acid (CENTRUM) chewable tablet Chew 1 tablet by mouth daily.          Review of Systems  Gastrointestinal: Negative for nausea,  vomiting, abdominal pain and diarrhea.  Genitourinary: Negative for dysuria.       Leaking of fluid Decreased fetal movement   Physical Exam   Blood pressure 114/62, pulse 114, temperature 98.6 F (37 C), temperature source Oral, resp. rate 20, height 5\' 4"  (1.626 m), weight 250 lb 8 oz (113.626 kg), last menstrual period 08/12/2011.  Physical Exam  Nursing note and vitals reviewed. Constitutional: She is oriented to person, place, and time. She appears well-developed and well-nourished.  HENT:  Head: Normocephalic.  Eyes: EOM are normal.  Neck: Neck supple.  GI: Soft. There is no tenderness.  Genitourinary:       Speculum exam: Vagina - Minimal creamy discharge, no odor Cervix - No contact bleeding, no leaking with valsalva Bimanual exam: Cervix closed, thick Uterus gravid Adnexa non tender, no masses bilaterally Fern slide done Chaperone present for exam.  Musculoskeletal: Normal range of motion.  Neurological: She is alert and oriented to person, place, and time.  Skin: Skin is warm and dry.  Psychiatric: She has a normal mood and affect.    MAU Course  Procedures Fern slide - negative  MDM BPP 8/8  AFI normal, largest  pocket 7.6 cm  2255 consult with Dr. Ambrose Mantle re: plan of care  Assessment and Plan  No ROM Pregnancy [redacted] week gestation  Plan Will discharge Keep your appointments in the office Let your doctor know if you continue to feel less fetal movements, but your baby looks good on ultrasound today.  [name] 02/23/2012, 9:08 PM

## 2012-02-23 NOTE — Discharge Instructions (Signed)
Keep your appointments in the office Let your doctor know if you continue to feel less fetal movements, but your baby looks good on ultrasound today.

## 2012-03-15 ENCOUNTER — Ambulatory Visit (HOSPITAL_COMMUNITY)
Admission: RE | Admit: 2012-03-15 | Discharge: 2012-03-15 | Disposition: A | Discharge status: Home / Self Care | Payer: Medicaid Other | Source: Ambulatory Visit | Attending: Nurse Practitioner | Admitting: Nurse Practitioner

## 2012-03-15 DIAGNOSIS — E669 Obesity, unspecified: Secondary | ICD-10-CM | POA: Insufficient documentation

## 2012-03-15 DIAGNOSIS — O99210 Obesity complicating pregnancy, unspecified trimester: Secondary | ICD-10-CM | POA: Insufficient documentation

## 2012-03-15 DIAGNOSIS — O368190 Decreased fetal movements, unspecified trimester, not applicable or unspecified: Secondary | ICD-10-CM | POA: Insufficient documentation

## 2012-03-15 DIAGNOSIS — O99214 Obesity complicating childbirth: Secondary | ICD-10-CM

## 2012-04-08 ENCOUNTER — Other Ambulatory Visit (HOSPITAL_COMMUNITY): Payer: Self-pay | Admitting: Nurse Practitioner

## 2012-04-08 DIAGNOSIS — Z09 Encounter for follow-up examination after completed treatment for conditions other than malignant neoplasm: Secondary | ICD-10-CM

## 2012-04-12 ENCOUNTER — Ambulatory Visit (HOSPITAL_COMMUNITY)
Admission: RE | Admit: 2012-04-12 | Discharge: 2012-04-12 | Disposition: A | Discharge status: Home / Self Care | Payer: Medicaid Other | Source: Ambulatory Visit | Attending: Nurse Practitioner | Admitting: Nurse Practitioner

## 2012-04-12 DIAGNOSIS — Z09 Encounter for follow-up examination after completed treatment for conditions other than malignant neoplasm: Secondary | ICD-10-CM

## 2012-04-12 DIAGNOSIS — E669 Obesity, unspecified: Secondary | ICD-10-CM | POA: Insufficient documentation

## 2012-04-12 DIAGNOSIS — O368190 Decreased fetal movements, unspecified trimester, not applicable or unspecified: Secondary | ICD-10-CM | POA: Insufficient documentation

## 2012-04-12 DIAGNOSIS — Z3689 Encounter for other specified antenatal screening: Secondary | ICD-10-CM | POA: Insufficient documentation

## 2012-04-12 NOTE — ED Notes (Signed)
Pt denies problems to day. States + FM.  

## 2012-04-12 NOTE — Progress Notes (Signed)
Patient seen today  for follow up ultrasound.  See full report in [facility].  Alpha Gula, MD  Single IUP at 34 6/7 weeks Interval growth is appropriate (66th %) Normal amniotic fluid volume The sagital spine was well visualized.  Somewhat limited views of the anatomy due to late gestational age and fetal position.  Recommend follow up ultrasounds as clinically indicated

## 2012-05-05 ENCOUNTER — Encounter (HOSPITAL_COMMUNITY): Payer: Self-pay | Admitting: *Deleted

## 2012-05-05 ENCOUNTER — Telehealth (HOSPITAL_COMMUNITY): Payer: Self-pay | Admitting: *Deleted

## 2012-05-05 NOTE — Telephone Encounter (Signed)
Preadmission screen  

## 2012-05-09 ENCOUNTER — Inpatient Hospital Stay (HOSPITAL_COMMUNITY)
Admission: AD | Admit: 2012-05-09 | Discharge: 2012-05-09 | Disposition: A | Discharge status: Home / Self Care | Payer: Medicaid Other | Source: Ambulatory Visit | Attending: Obstetrics and Gynecology | Admitting: Obstetrics and Gynecology

## 2012-05-09 ENCOUNTER — Encounter (HOSPITAL_COMMUNITY): Payer: Self-pay | Admitting: *Deleted

## 2012-05-09 DIAGNOSIS — O479 False labor, unspecified: Secondary | ICD-10-CM | POA: Insufficient documentation

## 2012-05-09 NOTE — MAU Note (Signed)
PT SAYS SHE HAS BEEN HURTING SINCE 0430.  DENIES HSV AND MRSA.  VE - 1 CM- ON WED.

## 2012-05-09 NOTE — Discharge Instructions (Signed)
If your contractions continue today, call the office prior to 4p.m. To get re-evaluated.  Normal Labor and Delivery Your caregiver must first be sure you are in labor. Signs of labor include:  You may pass what is called "the mucus plug" before labor begins. This is a small amount of blood stained mucus.   Regular uterine contractions.   The time between contractions get closer together.   The discomfort and pain gradually gets more intense.   Pains are mostly located in the back.   Pains get worse when walking.   The cervix (the opening of the uterus becomes thinner (begins to efface) and opens up (dilates).  Once you are in labor and admitted into the hospital or care center, your caregiver will do the following:  A complete physical examination.   Check your vital signs (blood pressure, pulse, temperature and the fetal heart rate).   Do a vaginal examination (using a sterile glove and lubricant) to determine:   The position (presentation) of the baby (head [vertex] or buttock first).   The level (station) of the baby's head in the birth canal.   The effacement and dilatation of the cervix.   You may have your pubic hair shaved and be given an enema depending on your caregiver and the circumstance.   An electronic monitor is usually placed on your abdomen. The monitor follows the length and intensity of the contractions, as well as the baby's heart rate.   Usually, your caregiver will insert an IV in your arm with a bottle of sugar water. This is done as a precaution so that medications can be given to you quickly during labor or delivery.  NORMAL LABOR AND DELIVERY IS DIVIDED UP INTO 3 STAGES: First Stage This is when regular contractions begin and the cervix begins to efface and dilate. This stage can last from 3 to 15 hours. The end of the first stage is when the cervix is 100% effaced and 10 centimeters dilated. Pain medications may be given by   Injection (morphine,  demerol, etc.)   Regional anesthesia (spinal, caudal or epidural, anesthetics given in different locations of the spine). Paracervical pain medication may be given, which is an injection of and anesthetic on each side of the cervix.  A pregnant woman may request to have "Natural Childbirth" which is not to have any medications or anesthesia during her labor and delivery. Second Stage This is when the baby comes down through the birth canal (vagina) and is born. This can take 1 to 4 hours. As the baby's head comes down through the birth canal, you may feel like you are going to have a bowel movement. You will get the urge to bear down and push until the baby is delivered. As the baby's head is being delivered, the caregiver will decide if an episiotomy (a cut in the perineum and vagina area) is needed to prevent tearing of the tissue in this area. The episiotomy is sewn up after the delivery of the baby and placenta. Sometimes a mask with nitrous oxide is given for the mother to breath during the delivery of the baby to help if there is too much pain. The end of Stage 2 is when the baby is fully delivered. Then when the umbilical cord stops pulsating it is clamped and cut. Third Stage The third stage begins after the baby is completely delivered and ends after the placenta (afterbirth) is delivered. This usually takes 5 to 30 minutes. After the  placenta is delivered, a medication is given either by intravenous or injection to help contract the uterus and prevent bleeding. The third stage is not painful and pain medication is usually not necessary. If an episiotomy was done, it is repaired at this time. After the delivery, the mother is watched and monitored closely for 1 to 2 hours to make sure there is no postpartum bleeding (hemorrhage). If there is a lot of bleeding, medication is given to contract the uterus and stop the bleeding. Document Released: 08/04/2008 Document Revised: 10/15/2011 Document  Reviewed: 08/04/2008 [facility] Patient Information 2012 [place], [place].

## 2012-05-16 ENCOUNTER — Inpatient Hospital Stay (HOSPITAL_COMMUNITY)
Admission: RE | Admit: 2012-05-16 | Discharge: 2012-05-18 | DRG: 775 | Disposition: A | Discharge status: Home / Self Care | Payer: Medicaid Other | Source: Ambulatory Visit | Attending: Obstetrics and Gynecology | Admitting: Obstetrics and Gynecology

## 2012-05-16 ENCOUNTER — Inpatient Hospital Stay (HOSPITAL_COMMUNITY): Payer: Medicaid Other | Admitting: Anesthesiology

## 2012-05-16 ENCOUNTER — Encounter (HOSPITAL_COMMUNITY): Payer: Self-pay | Admitting: Anesthesiology

## 2012-05-16 ENCOUNTER — Encounter (HOSPITAL_COMMUNITY): Payer: Self-pay

## 2012-05-16 VITALS — BP 98/49 | HR 86 | Temp 97.5°F | Resp 18 | Ht 64.0 in | Wt 256.0 lb

## 2012-05-16 DIAGNOSIS — Z22330 Carrier of Group B streptococcus: Secondary | ICD-10-CM

## 2012-05-16 DIAGNOSIS — Z3490 Encounter for supervision of normal pregnancy, unspecified, unspecified trimester: Secondary | ICD-10-CM

## 2012-05-16 DIAGNOSIS — O99892 Other specified diseases and conditions complicating childbirth: Principal | ICD-10-CM | POA: Diagnosis present

## 2012-05-16 HISTORY — DX: Encounter for supervision of normal pregnancy, unspecified, unspecified trimester: Z34.90

## 2012-05-16 LAB — CBC: WBC: 8.5 10*3/uL (ref 4.0–10.5)

## 2012-05-16 LAB — TYPE AND SCREEN: ABO/RH(D): A POS

## 2012-05-16 LAB — RPR: RPR Ser Ql: NONREACTIVE

## 2012-05-16 LAB — ABO/RH: ABO/RH(D): A POS

## 2012-05-16 MED ORDER — EPHEDRINE 5 MG/ML INJ: 10.0000 mg | INTRAVENOUS | Status: DC | PRN

## 2012-05-16 MED ORDER — FENTANYL 2.5 MCG/ML BUPIVACAINE 1/10 % EPIDURAL INFUSION (WH - ANES)
14.0000 mL/h | INTRAMUSCULAR | Status: DC
  Administered 2012-05-16 (×3): 14 mL/h via EPIDURAL
  Filled 2012-05-16 (×4): qty 60

## 2012-05-16 MED ORDER — LORATADINE 10 MG PO TABS
10.0000 mg | ORAL_TABLET | Freq: Every day | ORAL | Status: DC
  Administered 2012-05-16: 10 mg via ORAL
  Filled 2012-05-16 (×2): qty 1

## 2012-05-16 MED ORDER — ACETAMINOPHEN 325 MG PO TABS: 650.0000 mg | ORAL_TABLET | ORAL | Status: DC | PRN

## 2012-05-16 MED ORDER — PHENYLEPHRINE 40 MCG/ML (10ML) SYRINGE FOR IV PUSH (FOR BLOOD PRESSURE SUPPORT)
80.0000 ug | PREFILLED_SYRINGE | INTRAVENOUS | Status: DC | PRN
  Filled 2012-05-16: qty 5

## 2012-05-16 MED ORDER — PRENATAL MULTIVITAMIN CH
1.0000 | ORAL_TABLET | Freq: Every day | ORAL | Status: DC
  Administered 2012-05-16: 1 via ORAL
  Filled 2012-05-16: qty 1

## 2012-05-16 MED ORDER — EPHEDRINE 5 MG/ML INJ
10.0000 mg | INTRAVENOUS | Status: AC | PRN
  Administered 2012-05-16 (×2): 10 mg via INTRAVENOUS
  Filled 2012-05-16: qty 4

## 2012-05-16 MED ORDER — LACTATED RINGERS IV SOLN: 500.0000 mL | INTRAVENOUS | Status: DC | PRN

## 2012-05-16 MED ORDER — LACTATED RINGERS IV SOLN: 500.0000 mL | Freq: Once | INTRAVENOUS | Status: DC

## 2012-05-16 MED ORDER — LIDOCAINE HCL (PF) 1 % IJ SOLN
30.0000 mL | INTRAMUSCULAR | Status: DC | PRN
  Filled 2012-05-16: qty 30

## 2012-05-16 MED ORDER — LACTATED RINGERS IV SOLN
INTRAVENOUS | Status: DC
  Administered 2012-05-16 (×4): via INTRAVENOUS

## 2012-05-16 MED ORDER — OXYTOCIN BOLUS FROM INFUSION
250.0000 mL | Freq: Once | INTRAVENOUS | Status: DC
  Filled 2012-05-16: qty 500

## 2012-05-16 MED ORDER — PENICILLIN G POTASSIUM 5000000 UNITS IJ SOLR
2.5000 10*6.[IU] | INTRAVENOUS | Status: DC
  Administered 2012-05-16 (×3): 2.5 10*6.[IU] via INTRAVENOUS
  Filled 2012-05-16 (×8): qty 2.5

## 2012-05-16 MED ORDER — LIDOCAINE-EPINEPHRINE (PF) 2 %-1:200000 IJ SOLN
INTRAMUSCULAR | Status: DC | PRN
  Administered 2012-05-16: 4 mL via EPIDURAL

## 2012-05-16 MED ORDER — OXYCODONE-ACETAMINOPHEN 5-325 MG PO TABS: 1.0000 | ORAL_TABLET | ORAL | Status: DC | PRN

## 2012-05-16 MED ORDER — ONDANSETRON HCL 4 MG/2ML IJ SOLN
4.0000 mg | Freq: Four times a day (QID) | INTRAMUSCULAR | Status: DC | PRN
  Administered 2012-05-16: 4 mg via INTRAVENOUS
  Filled 2012-05-16: qty 2

## 2012-05-16 MED ORDER — TERBUTALINE SULFATE 1 MG/ML IJ SOLN: 0.2500 mg | Freq: Once | INTRAMUSCULAR | Status: AC | PRN

## 2012-05-16 MED ORDER — LIDOCAINE HCL (PF) 1 % IJ SOLN
INTRAMUSCULAR | Status: DC | PRN
  Administered 2012-05-16 (×4): 4 mL

## 2012-05-16 MED ORDER — FLEET ENEMA 7-19 GM/118ML RE ENEM: 1.0000 | ENEMA | RECTAL | Status: DC | PRN

## 2012-05-16 MED ORDER — OXYTOCIN 40 UNITS IN LACTATED RINGERS INFUSION - SIMPLE MED: 62.5000 mL/h | Freq: Once | INTRAVENOUS | Status: DC

## 2012-05-16 MED ORDER — OXYTOCIN 40 UNITS IN LACTATED RINGERS INFUSION - SIMPLE MED
1.0000 m[IU]/min | INTRAVENOUS | Status: DC
  Administered 2012-05-16: 2 m[IU]/min via INTRAVENOUS
  Filled 2012-05-16: qty 1000

## 2012-05-16 MED ORDER — FENTANYL 2.5 MCG/ML BUPIVACAINE 1/10 % EPIDURAL INFUSION (WH - ANES)
INTRAMUSCULAR | Status: DC | PRN
  Administered 2012-05-16: 14 mL/h via EPIDURAL

## 2012-05-16 MED ORDER — ALBUTEROL SULFATE HFA 108 (90 BASE) MCG/ACT IN AERS
2.0000 | INHALATION_SPRAY | Freq: Four times a day (QID) | RESPIRATORY_TRACT | Status: DC | PRN
  Filled 2012-05-16: qty 6.7

## 2012-05-16 MED ORDER — DEXTROSE 5 % IV SOLN
5.0000 10*6.[IU] | Freq: Once | INTRAVENOUS | Status: AC
  Administered 2012-05-16: 5 10*6.[IU] via INTRAVENOUS
  Filled 2012-05-16: qty 5

## 2012-05-16 MED ORDER — BUTORPHANOL TARTRATE 2 MG/ML IJ SOLN: 2.0000 mg | INTRAMUSCULAR | Status: DC | PRN

## 2012-05-16 MED ORDER — PHENYLEPHRINE 40 MCG/ML (10ML) SYRINGE FOR IV PUSH (FOR BLOOD PRESSURE SUPPORT): 80.0000 ug | PREFILLED_SYRINGE | INTRAVENOUS | Status: DC | PRN

## 2012-05-16 MED ORDER — CITRIC ACID-SODIUM CITRATE 334-500 MG/5ML PO SOLN: 30.0000 mL | ORAL | Status: DC | PRN

## 2012-05-16 MED ORDER — IBUPROFEN 600 MG PO TABS: 600.0000 mg | ORAL_TABLET | Freq: Four times a day (QID) | ORAL | Status: DC | PRN

## 2012-05-16 MED ORDER — DIPHENHYDRAMINE HCL 50 MG/ML IJ SOLN: 12.5000 mg | INTRAMUSCULAR | Status: DC | PRN

## 2012-05-16 NOTE — Anesthesia Preprocedure Evaluation (Signed)
Anesthesia Evaluation  Patient identified by MRN, date of birth, ID band Patient awake    Reviewed: Allergy & Precautions, H&P , NPO status , Patient's Chart, lab work & pertinent test results  Airway Mallampati: III TM Distance: >3 FB Neck ROM: full    Dental No notable dental hx. (+) Teeth Intact   Pulmonary asthma ,  breath sounds clear to auscultation  Pulmonary exam normal       Cardiovascular negative cardio ROS  Rhythm:regular Rate:Normal     Neuro/Psych  Headaches, PSYCHIATRIC DISORDERS Depression  Neuromuscular disease negative psych ROS   GI/Hepatic Neg liver ROS, Medicated and Controlled,  Endo/Other  Morbid obesity  Renal/GU negative Renal ROS  negative genitourinary   Musculoskeletal   Abdominal Normal abdominal exam  (+)   Peds  Hematology negative hematology ROS (+)   Anesthesia Other Findings   Reproductive/Obstetrics (+) Pregnancy                           Anesthesia Physical Anesthesia Plan  ASA: III  Anesthesia Plan: Epidural   Post-op Pain Management:    Induction:   Airway Management Planned:   Additional Equipment:   Intra-op Plan:   Post-operative Plan:   Informed Consent: I have reviewed the patients History and Physical, chart, labs and discussed the procedure including the risks, benefits and alternatives for the proposed anesthesia with the patient or authorized representative who has indicated his/her understanding and acceptance.     Plan Discussed with: Anesthesiologist  Anesthesia Plan Comments:         Anesthesia Quick Evaluation

## 2012-05-16 NOTE — Anesthesia Procedure Notes (Addendum)
Epidural Patient location during procedure: OB Start time: 05/16/2012 1:32 PM  Staffing Anesthesiologist: Ciria Bernardini A. Performed by: anesthesiologist   Preanesthetic Checklist Completed: patient identified, site marked, surgical consent, pre-op evaluation, timeout performed, IV checked, risks and benefits discussed and monitors and equipment checked  Epidural Patient position: sitting Prep: site prepped and draped and DuraPrep Patient monitoring: continuous pulse ox and blood pressure Approach: midline Injection technique: LOR air  Needle:  Needle type: Tuohy  Needle gauge: 17 G Needle length: 9 cm Needle insertion depth: 7 cm Catheter type: closed end flexible Catheter size: 19 Gauge Catheter at skin depth: 12 cm Test dose: negative and Other  Assessment Events: blood not aspirated, injection not painful, no injection resistance, negative IV test and no paresthesia  Additional Notes Patient identified. Risks and benefits discussed including failed block, incomplete  Pain control, post dural puncture headache, nerve damage, paralysis, blood pressure Changes, nausea, vomiting, reactions to medications-both toxic and allergic and post Partum back pain. All questions were answered. Patient expressed understanding and wished to proceed. Sterile technique was used throughout procedure. Epidural site was Dressed with sterile barrier dressing. No paresthesias, signs of intravascular injection Or signs of intrathecal spread were encountered.  Patient was more comfortable after the epidural was dosed. Please see RN's note for documentation of vital signs and FHR which are stable.   Epidural Patient location during procedure: OB Start time: 05/16/2012 2:57 PM  Staffing Anesthesiologist: Dailey Buccheri A. Performed by: anesthesiologist   Preanesthetic Checklist Completed: patient identified, site marked, surgical consent, pre-op evaluation, timeout performed, IV checked,  risks and benefits discussed and monitors and equipment checked  Epidural Patient position: sitting Prep: site prepped and draped and DuraPrep Patient monitoring: continuous pulse ox and blood pressure Approach: midline Injection technique: LOR air  Needle:  Needle type: Tuohy  Needle gauge: 17 G Needle length: 9 cm Needle insertion depth: 8 cm Catheter type: closed end flexible Catheter size: 19 Gauge Catheter at skin depth: 12 cm Test dose: negative and Other  Assessment Events: blood not aspirated, injection not painful, no injection resistance, negative IV test and no paresthesia  Additional Notes Patient identified. Risks and benefits discussed including failed block, incomplete  Pain control, post dural puncture headache, nerve damage, paralysis, blood pressure Changes, nausea, vomiting, reactions to medications-both toxic and allergic and post Partum back pain. All questions were answered. Patient expressed understanding and wished to proceed. Sterile technique was used throughout procedure. Epidural site was Dressed with sterile barrier dressing. No paresthesias, signs of intravascular injection Or signs of intrathecal spread were encountered.  Patient was more comfortable after the epidural was dosed. Please see RN's note for documentation of vital signs and FHR which are stable.

## 2012-05-16 NOTE — Progress Notes (Signed)
[name] is a 31 y.o. G2P1001 at [redacted]w[redacted]d admitted for induction of labor due to Elective at term.  Subjective: comf with epidural, had to have replaced.    Objective: BP 121/54  Pulse 96  Temp 97.8 F (36.6 C) (Oral)  Resp 18  Ht 5\' 4"  (1.626 m)  Wt 116.121 kg (256 lb)  BMI 43.94 kg/m2  LMP 08/12/2011      FHT:  FHR: 140-150 bpm, variability: moderate,  accelerations:  Present,  decelerations:  Present earlies, occasional UC:   regular, every 3 minutes SVE:   Dilation: 5.5 Effacement (%): 80 Station: -2 Exam by:: Bovard, J MD  Labs: Lab Results  Component Value Date   WBC 8.5 05/16/2012   HGB 11.7* 05/16/2012   HCT 36.1 05/16/2012   MCV 83.2 05/16/2012   PLT 246 05/16/2012    Assessment / Plan: Induction of labor due to term with favorable cervix,  progressing well on pitocin  Labor: Progressing slowly, IUPC placed w/o diff/comp Preeclampsia:  no signs or symptoms of toxicity Fetal Wellbeing:  Category II Pain Control:  Epidural I/D:  n/a Anticipated MOD:  NSVD or LTCS, d/w pt  BOVARD,Jazzy Parmer 05/16/2012, 5:38 PM

## 2012-05-16 NOTE — Progress Notes (Signed)
Patient ID: [name], female   DOB: 24-Mar-1981, 31 y.o.   MRN: [id number] 30yo G2P1001 at 39+ for iol, making slow progress.  Adequate contractions 170-215 on pitocin, will bump and monitor. FHTs 150-160, mod var toco q 2-4 min  Pitocin SVE 6.7/90/0, not well applied to cervix  Continue current management Recheck at 11pm, adjust POC with SVE

## 2012-05-16 NOTE — Progress Notes (Signed)
Patient ID: [name], female   DOB: 12-Mar-1981, 30 y.o.   MRN: [id number]  Getting uncomfortable with ctx.  +FM  AF VSS gen NAD FHT 150 good var toco Q 2-39min AROM for light meconium, d/w pt, will watch  Pitocin at 12 mU/min  Continue current mgmt

## 2012-05-16 NOTE — H&P (Signed)
[name] is a 31 y.o. female G2P1001 at 39+ for IOL.  Pregnancy complicated by maternal h/o depression, fibromyalgia, GERD, Asthma, Migraines and mood disorder.  Gbbs positive,  +FM, no LOF, no VB, occ ctx.  . Maternal Medical History:  Contractions: Frequency: irregular.    Fetal activity: Perceived fetal activity is normal.      OB History    Grav Para Term Preterm Abortions TAB SAB Ect Mult Living   2 1 1  0 0 0 0 0 0 1    G1 TSVD female 7#8, G2present; no abn pap, bu HR HPV positive, no STDs Past Medical History  Diagnosis Date  . Asthma   . Fibromyalgia muscle pain   . Fibromyalgia   . Mental disorder   . Headache     migraines  . Interstitial cystitis   . GERD (gastroesophageal reflux disease)   . Depression   . Abnormal Pap smear   . Normal pregnancy 05/16/2012   Past Surgical History  Procedure Date  . Breast reduction surgery   . Appendectomy   . Cholecystectomy    Family History: family history includes Asthma in her maternal grandmother and mother; Depression in her mother; Heart attack in her maternal grandfather; Heart disease in her maternal grandfather; and Hypertension in her maternal grandmother and mother. Social History:  reports that she quit smoking about 3 years ago. She has never used smokeless tobacco. She reports that she does not drink alcohol or use illicit drugs. Meds PNV All Sulfa   Prenatal Transfer Tool  Maternal Diabetes: Nono Genetic Screening: NormalWNL Maternal Ultrasounds/Referrals: Normallimited by obesity Fetal Ultrasounds or other Referrals:  Nonenone Maternal Substance Abuse:  Nonone Significant Maternal Medications:  Nonenone Significant Maternal Lab Results:  Lab values include: Group B Strep positivegbbs + Other Comments:  None  Review of Systems  Constitutional: Negative.   HENT: Negative.   Eyes: Negative.   Respiratory: Negative.   Cardiovascular: Negative.   Gastrointestinal: Negative.   Genitourinary: Negative.     Musculoskeletal: Negative.   Skin: Negative.   Neurological: Negative.   Psychiatric/Behavioral: Negative.       Last menstrual period 08/12/2011. Maternal Exam:  Uterine Assessment: Contraction frequency is irregular.   Abdomen: Fundal height is appropriate for gestation.   Estimated fetal weight is 7-8#.   Fetal presentation: vertex     Physical Exam  Constitutional: She is oriented to person, place, and time. She appears well-developed and well-nourished.  HENT:  Head: Normocephalic and atraumatic.  Eyes: Conjunctivae are normal. Pupils are equal, round, and reactive to light.  Neck: Normal range of motion. Neck supple.  Cardiovascular: Normal rate and regular rhythm.   Respiratory: Effort normal and breath sounds normal. No respiratory distress.  GI: Soft. Bowel sounds are normal. There is no tenderness.  Musculoskeletal: Normal range of motion.  Neurological: She is alert and oriented to person, place, and time.  Skin: Skin is warm and dry.  Psychiatric: She has a normal mood and affect. Her behavior is normal.    Prenatal labs: ABO, Rh: A/Positive/-- (12/11 0000) Antibody: Negative (12/11 0000) Rubella: Immune (12/11 0000) RPR: Nonreactive (12/11 0000)  HBsAg: Negative (12/11 0000)  HIV: Non-reactive (12/11 0000)  GBS: Positive (12/11 0000)  Hgb11.9/Pap WNL, HR HPV positive/Plt 320K/ GC neg/ Chl neg/ CF neg/ First Tri and AFP WNL/glucla WNL/  [place] EDC 7/14, cwd; nl anat but limited, ant plac, female F/u US limited by habitus  Assessment/Plan: 30yo G2P1001 at 39+ for iol PCN for  gbbs + Pitocin and then AROM to induce Expect SVD   BOVARD,Ardis Lawley 05/16/2012, 12:47 AM

## 2012-05-16 NOTE — Progress Notes (Signed)
Patient ID: [name], female   DOB: [birth year], 31 y.o.   MRN: [id number]  Reviewed POC, will AROM 4 hr after PCN.  Reviewed H&P, no changes.   FHTs 150's, mod var toco q 4 min  Continue IOL with pitocin and AROM Expect SVD

## 2012-05-17 ENCOUNTER — Encounter (HOSPITAL_COMMUNITY): Payer: Self-pay

## 2012-05-17 LAB — CBC
HCT: 32 % — ABNORMAL LOW (ref 36.0–46.0)
MCH: 27.8 pg (ref 26.0–34.0)
MCV: 84 fL (ref 78.0–100.0)
RBC: 3.81 MIL/uL — ABNORMAL LOW (ref 3.87–5.11)
WBC: 13.3 10*3/uL — ABNORMAL HIGH (ref 4.0–10.5)

## 2012-05-17 MED ORDER — LANOLIN HYDROUS EX OINT: TOPICAL_OINTMENT | CUTANEOUS | Status: DC | PRN

## 2012-05-17 MED ORDER — OXYCODONE-ACETAMINOPHEN 5-325 MG PO TABS
1.0000 | ORAL_TABLET | ORAL | Status: DC | PRN
  Administered 2012-05-17: 2 via ORAL
  Administered 2012-05-17 (×2): 1 via ORAL
  Filled 2012-05-17 (×4): qty 1

## 2012-05-17 MED ORDER — ONDANSETRON HCL 4 MG/2ML IJ SOLN: 4.0000 mg | INTRAMUSCULAR | Status: DC | PRN

## 2012-05-17 MED ORDER — DIBUCAINE 1 % RE OINT: 1.0000 "application " | TOPICAL_OINTMENT | RECTAL | Status: DC | PRN

## 2012-05-17 MED ORDER — LACTATED RINGERS IV SOLN: INTRAVENOUS | Status: DC

## 2012-05-17 MED ORDER — SENNOSIDES-DOCUSATE SODIUM 8.6-50 MG PO TABS
2.0000 | ORAL_TABLET | Freq: Every day | ORAL | Status: DC
  Administered 2012-05-17: 2 via ORAL

## 2012-05-17 MED ORDER — DIPHENHYDRAMINE HCL 25 MG PO CAPS: 25.0000 mg | ORAL_CAPSULE | Freq: Four times a day (QID) | ORAL | Status: DC | PRN

## 2012-05-17 MED ORDER — ONDANSETRON HCL 4 MG PO TABS: 4.0000 mg | ORAL_TABLET | ORAL | Status: DC | PRN

## 2012-05-17 MED ORDER — TETANUS-DIPHTH-ACELL PERTUSSIS 5-2.5-18.5 LF-MCG/0.5 IM SUSP: 0.5000 mL | Freq: Once | INTRAMUSCULAR | Status: DC

## 2012-05-17 MED ORDER — SIMETHICONE 80 MG PO CHEW: 80.0000 mg | CHEWABLE_TABLET | ORAL | Status: DC | PRN

## 2012-05-17 MED ORDER — PRENATAL MULTIVITAMIN CH
1.0000 | ORAL_TABLET | Freq: Every day | ORAL | Status: DC
  Filled 2012-05-17: qty 1

## 2012-05-17 MED ORDER — WITCH HAZEL-GLYCERIN EX PADS: 1.0000 "application " | MEDICATED_PAD | CUTANEOUS | Status: DC | PRN

## 2012-05-17 MED ORDER — IBUPROFEN 600 MG PO TABS
600.0000 mg | ORAL_TABLET | Freq: Four times a day (QID) | ORAL | Status: DC
  Administered 2012-05-17 – 2012-05-18 (×5): 600 mg via ORAL
  Filled 2012-05-17 (×6): qty 1

## 2012-05-17 MED ORDER — BENZOCAINE-MENTHOL 20-0.5 % EX AERO: 1.0000 "application " | INHALATION_SPRAY | CUTANEOUS | Status: DC | PRN

## 2012-05-17 MED ORDER — ZOLPIDEM TARTRATE 5 MG PO TABS: 5.0000 mg | ORAL_TABLET | Freq: Every evening | ORAL | Status: DC | PRN

## 2012-05-17 MED ORDER — PNEUMOCOCCAL VAC POLYVALENT 25 MCG/0.5ML IJ INJ
0.5000 mL | INJECTION | INTRAMUSCULAR | Status: AC
  Administered 2012-05-18: 0.5 mL via INTRAMUSCULAR
  Filled 2012-05-17: qty 0.5

## 2012-05-17 MED ORDER — PRENATAL MULTIVITAMIN CH: 1.0000 | ORAL_TABLET | Freq: Every day | ORAL | Status: DC

## 2012-05-17 MED ORDER — LORATADINE 10 MG PO TABS
10.0000 mg | ORAL_TABLET | Freq: Every day | ORAL | Status: DC
  Administered 2012-05-17 – 2012-05-18 (×2): 10 mg via ORAL
  Filled 2012-05-17 (×2): qty 1

## 2012-05-17 NOTE — Progress Notes (Signed)
UR chart review completed.  

## 2012-05-17 NOTE — Anesthesia Postprocedure Evaluation (Signed)
  Anesthesia Post-op Note  Patient: [name]  Procedure(s) Performed: * No procedures listed *  Patient Location: Mother/Baby  Anesthesia Type: Epidural  Level of Consciousness: awake  Airway and Oxygen Therapy: Patient Spontanous Breathing  Post-op Pain: mild  Post-op Assessment: Patient's Cardiovascular Status Stable and Respiratory Function Stable  Post-op Vital Signs: stable  Complications: No apparent anesthesia complications

## 2012-05-17 NOTE — Progress Notes (Signed)
Post Partum Day 0 Subjective: no complaints, up ad lib, voiding and nl lochia, pain controlled  Objective: Blood pressure 111/70, pulse 101, temperature 98.8 F (37.1 C), temperature source Oral, resp. rate 18, height 5\' 4"  (1.626 m), weight 116.121 kg (256 lb), last menstrual period 08/12/2011, SpO2 96.00%, unknown if currently breastfeeding.  Physical Exam:  General: alert and no distress Lochia: appropriate Uterine Fundus: firm   Basename 05/17/12 0510 05/16/12 0730  HGB 10.6* 11.7*  HCT 32.0* 36.1    Assessment/Plan: Plan for discharge tomorrow or Thursday.  Routine PP care.     LOS: 1 day   BOVARD,[name] 05/17/2012, 8:28 AM

## 2012-05-18 MED ORDER — PRENATAL MULTIVITAMIN CH: 1.0000 | ORAL_TABLET | Freq: Every day | ORAL | Status: DC

## 2012-05-18 MED ORDER — IBUPROFEN 800 MG PO TABS: 800.0000 mg | ORAL_TABLET | Freq: Three times a day (TID) | ORAL | Status: AC | PRN

## 2012-05-18 MED ORDER — OXYCODONE-ACETAMINOPHEN 5-325 MG PO TABS: 1.0000 | ORAL_TABLET | Freq: Four times a day (QID) | ORAL | Status: AC | PRN

## 2012-05-18 NOTE — Discharge Summary (Signed)
Obstetric Discharge Summary Reason for Admission: induction of labor Prenatal Procedures: none Intrapartum Procedures: spontaneous vaginal delivery Postpartum Procedures: none Complications-Operative and Postpartum: 1st degree perineal laceration Hemoglobin  Date Value Range Status  05/17/2012 10.6* 12.0 - 15.0 g/dL Final     HCT  Date Value Range Status  05/17/2012 32.0* 36.0 - 46.0 % Final    Physical Exam:  General: alert and no distress Lochia: appropriate Uterine Fundus: firm  Discharge Diagnoses: Term Pregnancy-delivered  Discharge Information: Date: 05/18/2012 Activity: pelvic rest Diet: routine Medications: PNV, Ibuprofen and Percocet Condition: stable Instructions: refer to practice specific booklet Discharge to: home Follow-up Information    Follow up with BOVARD,Leonette Tischer, MD. Schedule an appointment as soon as possible for a visit in 6 weeks.   Contact information:   [address] [facility] [address] [place] [address] [phone]          Newborn Data: Live born female  Birth Weight: 8 lb 8 oz (3856 g) APGAR: 8, 9  Home with mother. Br/Bo, ? Contra  BOVARD,Deletha Jaffee 05/18/2012, 9:56 AM

## 2012-05-18 NOTE — Progress Notes (Signed)
Post Partum Day 1 Subjective: no complaints, up ad lib, voiding, tolerating PO and nl lochia, pain controlled  Objective: Blood pressure 98/49, pulse 86, temperature 97.5 F (36.4 C), temperature source Oral, resp. rate 18, height 5\' 4"  (1.626 m), weight 116.121 kg (256 lb), last menstrual period 08/12/2011, SpO2 93.00%, unknown if currently breastfeeding.  Physical Exam:  General: alert and no distress Lochia: appropriate Uterine Fundus: firm   Basename 05/17/12 0510 05/16/12 0730  HGB 10.6* 11.7*  HCT 32.0* 36.1    Assessment/Plan: Discharge home, Breastfeeding and Lactation consult.  D/c with Motrin, PNV, Percocet, f/u 6 weeks   LOS: 2 days   BOVARD,Mitsugi Schrader 05/18/2012, 9:50 AM

## 2012-08-22 ENCOUNTER — Emergency Department (HOSPITAL_COMMUNITY)
Admission: EM | Admit: 2012-08-22 | Discharge: 2012-08-22 | Disposition: A | Discharge status: Home / Self Care | Payer: Medicaid Other | Source: Home / Self Care | Attending: Family Medicine | Admitting: Family Medicine

## 2012-10-15 ENCOUNTER — Emergency Department (HOSPITAL_COMMUNITY)
Admission: EM | Admit: 2012-10-15 | Discharge: 2012-10-15 | Disposition: A | Discharge status: Home / Self Care | Payer: Medicaid Other | Source: Home / Self Care | Attending: Emergency Medicine | Admitting: Emergency Medicine

## 2012-10-15 ENCOUNTER — Encounter (HOSPITAL_COMMUNITY): Payer: Self-pay | Admitting: Emergency Medicine

## 2012-10-15 DIAGNOSIS — L600 Ingrowing nail: Secondary | ICD-10-CM

## 2012-10-15 MED ORDER — TRAMADOL HCL 50 MG PO TABS: 100.0000 mg | ORAL_TABLET | Freq: Three times a day (TID) | ORAL | Status: DC | PRN

## 2012-10-15 MED ORDER — CEPHALEXIN 500 MG PO CAPS: 500.0000 mg | ORAL_CAPSULE | Freq: Three times a day (TID) | ORAL | Status: DC

## 2012-10-15 NOTE — ED Notes (Signed)
Pt c/o ingrown toe nail on right big toe since 09/02/12... Went to another Urgent Care and was given Antibiotics. It helped w/the infection but not the pain... Sx today include: pain, vomiting, nauseas, diarrhea... Denies: fevers... She is alert w/no signs of distress.

## 2012-10-15 NOTE — ED Provider Notes (Signed)
Chief Complaint  Patient presents with  . Ingrown Toenail    History of Present Illness:   This is a 31 year old female who has a painful ingrown toenail of the right great toe medial nail fold for the past 2 months. She went to another urgent care on October 30 and was given an antibiotic. It's got a little bit better for while but then 3 days ago and again began to get worse. The nail fold is swollen, erythematous, and tender to touch. There's been no purulent drainage or granulation tissue.  Review of Systems:  Other than noted above, the patient denies any of the following symptoms: Systemic:  No fevers, chills, sweats, or aches.  No fatigue or tiredness. Musculoskeletal:  No joint pain, arthritis, bursitis, swelling, back pain, or neck pain. Neurological:  No muscular weakness, paresthesias, headache, or trouble with speech or coordination.  No dizziness.  PMFSH:  Past medical history, family history, social history, meds, and allergies were reviewed.  Physical Exam:   Vital signs:  BP 131/84  Pulse 92  Temp 99.1 F (37.3 C) (Oral)  Resp 18  SpO2 97%  LMP 09/19/2012  Breastfeeding? Yes Gen:  Alert and oriented times 3.  In no distress. Musculoskeletal: The medial nail fold the right great toe was swollen, erythematous, and tender to touch. There is no purulent drainage or granulation tissue. No pus under the nail. The nail is ingrown. Otherwise, all joints had a full a ROM with no swelling, bruising or deformity.  No edema, pulses full. Extremities were warm and pink.  Capillary refill was brisk.  Skin:  Clear, warm and dry.  No rash. Neuro:  Alert and oriented times 3.  Muscle strength was normal.  Sensation was intact to light touch.   Procedure Note:  Verbal informed consent was obtained from the patient.  Risks and benefits were outlined with the patient.  Patient understands and accepts these risks.  Identity of the patient was confirmed verbally and by armband.    Procedure  was performed as follows:  The toe was prepped with Betadine and alcohol and then anesthetized with a digital block with 5 mL of 2% Xylocaine without epinephrine. The nail was then split down to the base and the ingrown portion was avulsed, removing a 5 mm strip of the nail. There was minimal bleeding. A sterile dressing was applied and the patient was instructed in aftercare.  Patient tolerated the procedure well without any immediate complications.  Assessment:  The encounter diagnosis was Ingrown toenail.  Plan:   1.  The following meds were prescribed:   New Prescriptions   CEPHALEXIN (KEFLEX) 500 MG CAPSULE    Take 1 capsule (500 mg total) by mouth 3 (three) times daily.   TRAMADOL (ULTRAM) 50 MG TABLET    Take 2 tablets (100 mg total) by mouth every 8 (eight) hours as needed for pain.   2.  The patient was instructed in symptomatic care, including rest and activity, elevation, application of ice and compression.  Appropriate handouts were given. 3.  The patient was told to return if becoming worse in any way, if no better in 3 or 4 days, and given some red flag symptoms that would indicate earlier return.   4.  The patient was told to follow up  with Dr. Cristie Hem if this should recur.Reuben Likes, MD 10/15/12 2018

## 2012-10-17 LAB — POCT URINALYSIS DIP (DEVICE)
Glucose, UA: NEGATIVE mg/dL
Leukocytes, UA: NEGATIVE
Nitrite: NEGATIVE
Protein, ur: NEGATIVE mg/dL
Urobilinogen, UA: 0.2 mg/dL (ref 0.0–1.0)

## 2012-11-11 ENCOUNTER — Emergency Department (HOSPITAL_BASED_OUTPATIENT_CLINIC_OR_DEPARTMENT_OTHER): Payer: 59

## 2012-11-11 ENCOUNTER — Emergency Department (HOSPITAL_BASED_OUTPATIENT_CLINIC_OR_DEPARTMENT_OTHER)
Admission: EM | Admit: 2012-11-11 | Discharge: 2012-11-11 | Disposition: A | Discharge status: Home / Self Care | Payer: 59 | Attending: Emergency Medicine | Admitting: Emergency Medicine

## 2012-11-11 ENCOUNTER — Encounter (HOSPITAL_BASED_OUTPATIENT_CLINIC_OR_DEPARTMENT_OTHER): Payer: Self-pay | Admitting: *Deleted

## 2012-11-11 DIAGNOSIS — J45909 Unspecified asthma, uncomplicated: Secondary | ICD-10-CM | POA: Insufficient documentation

## 2012-11-11 DIAGNOSIS — Z8659 Personal history of other mental and behavioral disorders: Secondary | ICD-10-CM | POA: Insufficient documentation

## 2012-11-11 DIAGNOSIS — Z8742 Personal history of other diseases of the female genital tract: Secondary | ICD-10-CM | POA: Insufficient documentation

## 2012-11-11 DIAGNOSIS — Z79899 Other long term (current) drug therapy: Secondary | ICD-10-CM | POA: Insufficient documentation

## 2012-11-11 DIAGNOSIS — Z87891 Personal history of nicotine dependence: Secondary | ICD-10-CM | POA: Insufficient documentation

## 2012-11-11 DIAGNOSIS — Z8719 Personal history of other diseases of the digestive system: Secondary | ICD-10-CM | POA: Insufficient documentation

## 2012-11-11 DIAGNOSIS — Z8739 Personal history of other diseases of the musculoskeletal system and connective tissue: Secondary | ICD-10-CM | POA: Insufficient documentation

## 2012-11-11 DIAGNOSIS — R200 Anesthesia of skin: Secondary | ICD-10-CM

## 2012-11-11 DIAGNOSIS — R209 Unspecified disturbances of skin sensation: Secondary | ICD-10-CM | POA: Insufficient documentation

## 2012-11-11 DIAGNOSIS — Z8679 Personal history of other diseases of the circulatory system: Secondary | ICD-10-CM | POA: Insufficient documentation

## 2012-11-11 DIAGNOSIS — IMO0001 Reserved for inherently not codable concepts without codable children: Secondary | ICD-10-CM | POA: Insufficient documentation

## 2012-11-11 LAB — CBC WITH DIFFERENTIAL/PLATELET
Basophils Absolute: 0 10*3/uL (ref 0.0–0.1)
Eosinophils Relative: 1 % (ref 0–5)
Lymphocytes Relative: 27 % (ref 12–46)
Lymphs Abs: 2.7 10*3/uL (ref 0.7–4.0)
MCV: 82.6 fL (ref 78.0–100.0)
Neutro Abs: 6.6 10*3/uL (ref 1.7–7.7)
Neutrophils Relative %: 66 % (ref 43–77)
Platelets: 329 10*3/uL (ref 150–400)
RBC: 4.47 MIL/uL (ref 3.87–5.11)
RDW: 13.6 % (ref 11.5–15.5)
WBC: 10 10*3/uL (ref 4.0–10.5)

## 2012-11-11 LAB — BASIC METABOLIC PANEL
CO2: 25 mEq/L (ref 19–32)
Calcium: 9.2 mg/dL (ref 8.4–10.5)
Potassium: 4.3 mEq/L (ref 3.5–5.1)
Sodium: 138 mEq/L (ref 135–145)

## 2012-11-11 NOTE — ED Notes (Signed)
Pt amb to room 7 with quick steady gait in nad. Pt reports intermittant numbness to left face and left leg x 1/1. Pt states this numbness is not present at this time, "it comes and goes". Pt has clear speech, moe + x 4 ext, grips = and strong, pearl, smile symmetrical. Denies ha or any other c/o.

## 2012-11-11 NOTE — ED Provider Notes (Signed)
History     CSN: [id number]  Arrival date & time 11/11/12  0906   First MD Initiated Contact with Patient 11/11/12 [phone]      Chief Complaint  Patient presents with  . intermittent numbness to face, not present at this time.     (Consider location/radiation/quality/duration/timing/severity/associated sxs/prior treatment) HPI Comments: Patient presents with a three day history of numbness to the left face and left leg that comes and goes.  She denies any weakness, just a subjective feeling of numbness.  There is no headache or back or neck pain and she denies any bowel or bladder complaints.  She has a history of fibromyalgia but denies any new medications.  She denies any aggravating or alleviating factors.    The history is provided by the patient.    Past Medical History  Diagnosis Date  . Asthma   . Fibromyalgia muscle pain   . Fibromyalgia   . Mental disorder   . Headache     migraines  . Interstitial cystitis   . GERD (gastroesophageal reflux disease)   . Depression   . Abnormal Pap smear   . Normal pregnancy 05/16/2012  . SVD (spontaneous vaginal delivery) 05/17/2012    Past Surgical History  Procedure Date  . Breast reduction surgery   . Appendectomy   . Cholecystectomy     Family History  Problem Relation Age of Onset  . Hypertension Mother   . Asthma Mother   . Depression Mother   . Hypertension Maternal Grandmother   . Asthma Maternal Grandmother   . Heart attack Maternal Grandfather   . Heart disease Maternal Grandfather     History  Substance Use Topics  . Smoking status: Former Smoker    Quit date: 11/09/2008  . Smokeless tobacco: Never Used  . Alcohol Use: No    OB History    Grav Para Term Preterm Abortions TAB SAB Ect Mult Living   2 2 2  0 0 0 0 0 0 2      Review of Systems  All other systems reviewed and are negative.    Allergies  Sulfa antibiotics  Home Medications   Current Outpatient Rx  Name  Route  Sig  Dispense  Refill    . ALBUTEROL SULFATE HFA 108 (90 BASE) MCG/ACT IN AERS   Inhalation   Inhale 2 puffs into the lungs every 6 (six) hours as needed. For shortness of breath          . CEPHALEXIN 500 MG PO CAPS   Oral   Take 1 capsule (500 mg total) by mouth 3 (three) times daily.   30 capsule   0   . OMEGA-3 FATTY ACIDS 1000 MG PO CAPS   Oral   Take 1 g by mouth at bedtime.         Marland Kitchen PRENATAL MULTIVITAMIN CH   Oral   Take 1 tablet by mouth at bedtime.         Marland Kitchen PRENATAL MULTIVITAMIN CH   Oral   Take 1 tablet by mouth daily.   30 tablet   12   . PSEUDOEPHEDRINE HCL 30 MG PO TABS   Oral   Take 30 mg by mouth at bedtime as needed. For congestion         . TRAMADOL HCL 50 MG PO TABS   Oral   Take 2 tablets (100 mg total) by mouth every 8 (eight) hours as needed for pain.   30 tablet   0  BP 132/59  Pulse 89  Temp 98.4 F (36.9 C)  Resp 18  Ht 5\' 4"  (1.626 m)  Wt 250 lb (113.399 kg)  BMI 42.91 kg/m2  SpO2 100%  LMP 10/17/2012  Physical Exam  Nursing note and vitals reviewed. Constitutional: She is oriented to person, place, and time. She appears well-developed and well-nourished. No distress.  HENT:  Head: Normocephalic and atraumatic.  Eyes: EOM are normal. Pupils are equal, round, and reactive to light.  Neck: Normal range of motion. Neck supple.  Cardiovascular: Normal rate, regular rhythm and normal heart sounds.   Pulmonary/Chest: Effort normal and breath sounds normal. No respiratory distress.  Abdominal: Soft. Bowel sounds are normal.  Musculoskeletal: Normal range of motion.  Neurological: She is alert and oriented to person, place, and time. No cranial nerve deficit. She exhibits normal muscle tone. Coordination normal.  Skin: Skin is warm and dry. She is not diaphoretic.    ED Course  Procedures (including critical care time)  Labs Reviewed - No data to display No results found.   No diagnosis found.    MDM  CT shows no sign of cva and labs  reveal a mildly elevated glucose that I have advised her to have rechecked in the future.  To return prn for any problems.        Geoffery Lyons, MD 11/11/12 1124

## 2012-12-26 ENCOUNTER — Encounter (HOSPITAL_BASED_OUTPATIENT_CLINIC_OR_DEPARTMENT_OTHER): Payer: Self-pay

## 2012-12-26 ENCOUNTER — Emergency Department (HOSPITAL_BASED_OUTPATIENT_CLINIC_OR_DEPARTMENT_OTHER)
Admission: EM | Admit: 2012-12-26 | Discharge: 2012-12-26 | Disposition: A | Discharge status: Home / Self Care | Payer: Medicaid Other | Attending: Emergency Medicine | Admitting: Emergency Medicine

## 2012-12-26 ENCOUNTER — Emergency Department (HOSPITAL_BASED_OUTPATIENT_CLINIC_OR_DEPARTMENT_OTHER): Payer: Medicaid Other

## 2012-12-26 DIAGNOSIS — Z8659 Personal history of other mental and behavioral disorders: Secondary | ICD-10-CM | POA: Insufficient documentation

## 2012-12-26 DIAGNOSIS — Z8719 Personal history of other diseases of the digestive system: Secondary | ICD-10-CM | POA: Insufficient documentation

## 2012-12-26 DIAGNOSIS — Z79899 Other long term (current) drug therapy: Secondary | ICD-10-CM | POA: Insufficient documentation

## 2012-12-26 DIAGNOSIS — Z8679 Personal history of other diseases of the circulatory system: Secondary | ICD-10-CM | POA: Insufficient documentation

## 2012-12-26 DIAGNOSIS — J45901 Unspecified asthma with (acute) exacerbation: Secondary | ICD-10-CM | POA: Insufficient documentation

## 2012-12-26 DIAGNOSIS — R05 Cough: Secondary | ICD-10-CM | POA: Insufficient documentation

## 2012-12-26 DIAGNOSIS — J069 Acute upper respiratory infection, unspecified: Secondary | ICD-10-CM | POA: Insufficient documentation

## 2012-12-26 DIAGNOSIS — Z87448 Personal history of other diseases of urinary system: Secondary | ICD-10-CM | POA: Insufficient documentation

## 2012-12-26 DIAGNOSIS — R059 Cough, unspecified: Secondary | ICD-10-CM | POA: Insufficient documentation

## 2012-12-26 MED ORDER — FLUTICASONE PROPIONATE 50 MCG/ACT NA SUSP: 2.0000 | Freq: Every day | NASAL | Status: DC

## 2012-12-26 MED ORDER — ALBUTEROL SULFATE HFA 108 (90 BASE) MCG/ACT IN AERS: 1.0000 | INHALATION_SPRAY | Freq: Four times a day (QID) | RESPIRATORY_TRACT | Status: DC | PRN

## 2012-12-26 NOTE — ED Notes (Signed)
MD at bedside. 

## 2012-12-26 NOTE — ED Provider Notes (Signed)
History     CSN: [id number]  Arrival date & time 12/26/12  0424   First MD Initiated Contact with Patient 12/26/12 [phone]      Chief Complaint  Patient presents with  . Nasal Congestion    (Consider location/radiation/quality/duration/timing/severity/associated sxs/prior treatment) Patient is a 32 y.o. female presenting with URI. The history is provided by the patient. No language interpreter was used.  URI Presenting symptoms: congestion and cough   Congestion:    Location:  Nasal   Interferes with sleep: no     Interferes with eating/drinking: no   Severity:  Mild Onset quality:  Sudden Duration:  3 days Timing:  Constant Progression:  Unchanged Chronicity:  New Relieved by:  Nothing Worsened by:  Nothing tried Ineffective treatments:  None tried Associated symptoms: wheezing   Associated symptoms: no arthralgias, no neck pain and no swollen glands   Risk factors: not elderly     Past Medical History  Diagnosis Date  . Asthma   . Fibromyalgia muscle pain   . Fibromyalgia   . Mental disorder   . Headache     migraines  . Interstitial cystitis   . GERD (gastroesophageal reflux disease)   . Depression   . Abnormal Pap smear   . Normal pregnancy 05/16/2012  . SVD (spontaneous vaginal delivery) 05/17/2012    Past Surgical History  Procedure Laterality Date  . Breast reduction surgery    . Appendectomy    . Cholecystectomy      Family History  Problem Relation Age of Onset  . Hypertension Mother   . Asthma Mother   . Depression Mother   . Hypertension Maternal Grandmother   . Asthma Maternal Grandmother   . Heart attack Maternal Grandfather   . Heart disease Maternal Grandfather     History  Substance Use Topics  . Smoking status: Former Smoker    Quit date: 11/09/2008  . Smokeless tobacco: Never Used  . Alcohol Use: No    OB History   Grav Para Term Preterm Abortions TAB SAB Ect Mult Living   2 2 2  0 0 0 0 0 0 2      Review of Systems  HENT:  Positive for congestion. Negative for neck pain.   Respiratory: Positive for cough and wheezing.   Musculoskeletal: Negative for arthralgias.  All other systems reviewed and are negative.    Allergies  Sulfa antibiotics  Home Medications   Current Outpatient Rx  Name  Route  Sig  Dispense  Refill  . albuterol (PROVENTIL HFA;VENTOLIN HFA) 108 (90 BASE) MCG/ACT inhaler   Inhalation   Inhale 2 puffs into the lungs every 6 (six) hours as needed. For shortness of breath          . pseudoephedrine (SUDAFED) 30 MG tablet   Oral   Take 30 mg by mouth at bedtime as needed. For congestion           BP 133/73  Pulse 99  Temp(Src) 97.4 F (36.3 C) (Oral)  Resp 20  Ht 5\' 4"  (1.626 m)  Wt 250 lb (113.399 kg)  BMI 42.89 kg/m2  SpO2 98%  LMP 12/12/2012  Physical Exam  Constitutional: She is oriented to person, place, and time. She appears well-developed and well-nourished. No distress.  HENT:  Head: Normocephalic and atraumatic.  Mouth/Throat: Oropharynx is clear and moist.  Clear colorless drainage down the back of the throat  Eyes: Conjunctivae are normal. Pupils are equal, round, and reactive to light.  Neck:  Normal range of motion. Neck supple.  Cardiovascular: Normal rate and regular rhythm.   Pulmonary/Chest: Effort normal and breath sounds normal. No stridor. She has no wheezes. She has no rales.  Abdominal: Soft. Bowel sounds are normal. There is no tenderness. There is no rebound and no guarding.  Musculoskeletal: Normal range of motion.  Lymphadenopathy:    She has no cervical adenopathy.  Neurological: She is alert and oriented to person, place, and time.  Skin: Skin is warm and dry.  Psychiatric: She has a normal mood and affect.    ED Course  Procedures (including critical care time)  Labs Reviewed - No data to display Dg Chest 2 View  12/26/2012  *RADIOLOGY REPORT*  Clinical Data: Cough, congestion  CHEST - 2 VIEW  Comparison: 04/23/2011  Findings: Mild  interstitial prominence / peribronchial cuffing. Mild lung base atelectasis.  No pleural effusion or pneumothorax. Cardiomediastinal contours within normal range.  No acute osseous finding. Right upper quadrant surgical clips.  IMPRESSION: Mild peribronchial cuffing, can be seen with bronchitis or reactive airway disease.   Original Report Authenticated By: Jearld Lesch, M.D.      No diagnosis found.    MDM  Will treat for URI        Odel Schmid K Caterin Tabares-Rasch, MD 12/26/12 [phone]

## 2012-12-26 NOTE — ED Notes (Signed)
Returned from xray

## 2012-12-26 NOTE — ED Notes (Signed)
Patient reports that she developed dry cough and congestion Saturday night, reports sore throat for same and ears feel full, no distress

## 2013-01-21 ENCOUNTER — Encounter (HOSPITAL_BASED_OUTPATIENT_CLINIC_OR_DEPARTMENT_OTHER): Payer: Self-pay | Admitting: *Deleted

## 2013-01-21 ENCOUNTER — Emergency Department (HOSPITAL_BASED_OUTPATIENT_CLINIC_OR_DEPARTMENT_OTHER)
Admission: EM | Admit: 2013-01-21 | Discharge: 2013-01-22 | Disposition: A | Discharge status: Home / Self Care | Payer: Medicaid Other | Attending: Emergency Medicine | Admitting: Emergency Medicine

## 2013-01-21 DIAGNOSIS — Z79899 Other long term (current) drug therapy: Secondary | ICD-10-CM | POA: Insufficient documentation

## 2013-01-21 DIAGNOSIS — Z8659 Personal history of other mental and behavioral disorders: Secondary | ICD-10-CM | POA: Insufficient documentation

## 2013-01-21 DIAGNOSIS — L03039 Cellulitis of unspecified toe: Secondary | ICD-10-CM | POA: Insufficient documentation

## 2013-01-21 DIAGNOSIS — Z8739 Personal history of other diseases of the musculoskeletal system and connective tissue: Secondary | ICD-10-CM | POA: Insufficient documentation

## 2013-01-21 DIAGNOSIS — Z87891 Personal history of nicotine dependence: Secondary | ICD-10-CM | POA: Insufficient documentation

## 2013-01-21 DIAGNOSIS — Z8679 Personal history of other diseases of the circulatory system: Secondary | ICD-10-CM | POA: Insufficient documentation

## 2013-01-21 DIAGNOSIS — J45909 Unspecified asthma, uncomplicated: Secondary | ICD-10-CM | POA: Insufficient documentation

## 2013-01-21 DIAGNOSIS — Z87448 Personal history of other diseases of urinary system: Secondary | ICD-10-CM | POA: Insufficient documentation

## 2013-01-21 DIAGNOSIS — Z8719 Personal history of other diseases of the digestive system: Secondary | ICD-10-CM | POA: Insufficient documentation

## 2013-01-21 MED ORDER — OXYCODONE-ACETAMINOPHEN 5-325 MG PO TABS: 1.0000 | ORAL_TABLET | Freq: Four times a day (QID) | ORAL | Status: DC | PRN

## 2013-01-21 MED ORDER — CLINDAMYCIN HCL 150 MG PO CAPS: 300.0000 mg | ORAL_CAPSULE | Freq: Three times a day (TID) | ORAL | Status: DC

## 2013-01-21 NOTE — ED Notes (Signed)
Pt states she has an ingrown toenail on the right great toe that has been giving her problems x 2-3 days. Hx same on left and had to have it "cut out".

## 2013-01-21 NOTE — ED Provider Notes (Signed)
History  This chart was scribed for Julie Chick, MD by Shari Heritage, ED Scribe. The patient was seen in room MH06/MH06. Patient's care was started at 2321.   CSN: [id number]  Arrival date & time 01/21/13  2122   First MD Initiated Contact with Patient 01/21/13 2321      Chief Complaint  Patient presents with  . Toe Pain     Patient is a 32 y.o. female presenting with toe pain. The history is provided by the patient. No language interpreter was used.  Toe Pain This is a new problem. The current episode started more than 2 days ago. The problem occurs constantly. The problem has not changed since onset.The symptoms are aggravated by walking (and weight bearing).     HPI Comments: CORALYN ROSELLI is a 32 y.o. female who presents to the Emergency Department complaining of moderate to severe, sharp, non-radiating right great toe pain onset 3-4 days ago. Pain is worse with bearing weight an ambulation. Patient states that she may have an ingrown toenail of the thumb. She states that she noticed some drainage from the area with scant blood. She denies fever, nausea, vomiting or chills. There is no red streaking. She has a medical history of GERD, depression and asthma. Her surgical history includes appendectomy and cholecystectomy. She is a former smoker.   Past Medical History  Diagnosis Date  . Asthma   . Fibromyalgia muscle pain   . Fibromyalgia   . Mental disorder   . Headache     migraines  . Interstitial cystitis   . GERD (gastroesophageal reflux disease)   . Depression   . Abnormal Pap smear   . Normal pregnancy 05/16/2012  . SVD (spontaneous vaginal delivery) 05/17/2012    Past Surgical History  Procedure Laterality Date  . Breast reduction surgery    . Appendectomy    . Cholecystectomy      Family History  Problem Relation Age of Onset  . Hypertension Mother   . Asthma Mother   . Depression Mother   . Hypertension Maternal Grandmother   . Asthma Maternal  Grandmother   . Heart attack Maternal Grandfather   . Heart disease Maternal Grandfather     History  Substance Use Topics  . Smoking status: Former Smoker    Quit date: 11/09/2008  . Smokeless tobacco: Never Used  . Alcohol Use: No    OB History   Grav Para Term Preterm Abortions TAB SAB Ect Mult Living   2 2 2  0 0 0 0 0 0 2      Review of Systems  Constitutional: Negative for fever and chills.  Gastrointestinal: Negative for nausea and vomiting.  All other systems reviewed and are negative.    Allergies  Sulfa antibiotics  Home Medications   Current Outpatient Rx  Name  Route  Sig  Dispense  Refill  . albuterol (PROVENTIL HFA;VENTOLIN HFA) 108 (90 BASE) MCG/ACT inhaler   Inhalation   Inhale 2 puffs into the lungs every 6 (six) hours as needed. For shortness of breath          . albuterol (PROVENTIL HFA;VENTOLIN HFA) 108 (90 BASE) MCG/ACT inhaler   Inhalation   Inhale 1-2 puffs into the lungs every 6 (six) hours as needed for wheezing.   1 Inhaler   0   . clindamycin (CLEOCIN) 150 MG capsule   Oral   Take 2 capsules (300 mg total) by mouth 3 (three) times daily.   21 capsule  0   . fluticasone (FLONASE) 50 MCG/ACT nasal spray   Nasal   Place 2 sprays into the nose daily.   16 g   0   . oxyCODONE-acetaminophen (PERCOCET/ROXICET) 5-325 MG per tablet   Oral   Take 1-2 tablets by mouth every 6 (six) hours as needed for pain.   15 tablet   0   . pseudoephedrine (SUDAFED) 30 MG tablet   Oral   Take 30 mg by mouth at bedtime as needed. For congestion           Triage Vitals: BP 138/75  Pulse 90  Temp(Src) 98.6 F (37 C) (Oral)  Resp 20  Ht 5\' 4"  (1.626 m)  Wt 250 lb (113.399 kg)  BMI 42.89 kg/m2  SpO2 100%  LMP 01/14/2013  Breastfeeding? Yes  Physical Exam  Constitutional: She is oriented to person, place, and time. She appears well-developed and well-nourished. No distress.  HENT:  Head: Normocephalic and atraumatic.  Eyes:  Conjunctivae and EOM are normal. Pupils are equal, round, and reactive to light.  Neck: Normal range of motion. Neck supple.  Cardiovascular: Normal rate, regular rhythm and normal heart sounds.   Pulmonary/Chest: Effort normal and breath sounds normal. No respiratory distress.  Musculoskeletal: Normal range of motion.  Left great toe with area of erythema with pus actively draining on the medial surface of toenail. No erythema, streaking up the toe or foot.  Neurological: She is alert and oriented to person, place, and time.  Skin: Skin is warm and dry.  Psychiatric: She has a normal mood and affect. Her behavior is normal.    ED Course  Procedures (including critical care time) DIAGNOSTIC STUDIES: Oxygen Saturation is 100% on room air, normal by my interpretation.    COORDINATION OF CARE: 11:48 PM- Patient informed of current plan for treatment and evaluation and agrees with plan at this time.      Labs Reviewed - No data to display No results found.   1. Paronychia of great toe, left       MDM  Pt presenting with pain in left great toe- she has small paronychia present with actively draining prurulent fluid,  No signs of erythema streaking up affected extremity.  Pt started on clindamycin- no I and D indicated as it is actively draining.  Given f/u information for podiatry, abx and pain med rx.  Also advised of warm soaks three times daily.  Discharged with strict return precautions.  Pt agreeable with plan.    I personally performed the services described in this documentation, which was scribed in my presence. The recorded information has been reviewed and is accurate.    Julie Chick, MD 01/22/13 [phone]

## 2013-08-11 ENCOUNTER — Emergency Department (HOSPITAL_BASED_OUTPATIENT_CLINIC_OR_DEPARTMENT_OTHER)
Admission: EM | Admit: 2013-08-11 | Discharge: 2013-08-11 | Disposition: A | Discharge status: Home / Self Care | Payer: Medicaid Other | Attending: Emergency Medicine | Admitting: Emergency Medicine

## 2013-08-11 ENCOUNTER — Encounter (HOSPITAL_BASED_OUTPATIENT_CLINIC_OR_DEPARTMENT_OTHER): Payer: Self-pay

## 2013-08-11 ENCOUNTER — Emergency Department (HOSPITAL_BASED_OUTPATIENT_CLINIC_OR_DEPARTMENT_OTHER): Payer: Medicaid Other

## 2013-08-11 DIAGNOSIS — Z8719 Personal history of other diseases of the digestive system: Secondary | ICD-10-CM | POA: Insufficient documentation

## 2013-08-11 DIAGNOSIS — Z87448 Personal history of other diseases of urinary system: Secondary | ICD-10-CM | POA: Insufficient documentation

## 2013-08-11 DIAGNOSIS — Z8639 Personal history of other endocrine, nutritional and metabolic disease: Secondary | ICD-10-CM | POA: Insufficient documentation

## 2013-08-11 DIAGNOSIS — Z862 Personal history of diseases of the blood and blood-forming organs and certain disorders involving the immune mechanism: Secondary | ICD-10-CM | POA: Insufficient documentation

## 2013-08-11 DIAGNOSIS — J45901 Unspecified asthma with (acute) exacerbation: Secondary | ICD-10-CM | POA: Insufficient documentation

## 2013-08-11 DIAGNOSIS — Z8659 Personal history of other mental and behavioral disorders: Secondary | ICD-10-CM | POA: Insufficient documentation

## 2013-08-11 DIAGNOSIS — Z8739 Personal history of other diseases of the musculoskeletal system and connective tissue: Secondary | ICD-10-CM | POA: Insufficient documentation

## 2013-08-11 DIAGNOSIS — IMO0002 Reserved for concepts with insufficient information to code with codable children: Secondary | ICD-10-CM | POA: Insufficient documentation

## 2013-08-11 DIAGNOSIS — Z87891 Personal history of nicotine dependence: Secondary | ICD-10-CM | POA: Insufficient documentation

## 2013-08-11 DIAGNOSIS — Z792 Long term (current) use of antibiotics: Secondary | ICD-10-CM | POA: Insufficient documentation

## 2013-08-11 DIAGNOSIS — Z79899 Other long term (current) drug therapy: Secondary | ICD-10-CM | POA: Insufficient documentation

## 2013-08-11 DIAGNOSIS — R0600 Dyspnea, unspecified: Secondary | ICD-10-CM

## 2013-08-11 HISTORY — DX: Polycystic ovarian syndrome: E28.2

## 2013-08-11 LAB — CBC WITH DIFFERENTIAL/PLATELET
Eosinophils Relative: 2 % (ref 0–5)
Hemoglobin: 13 g/dL (ref 12.0–15.0)
Lymphocytes Relative: 26 % (ref 12–46)
Lymphs Abs: 2.8 10*3/uL (ref 0.7–4.0)
MCV: 85.2 fL (ref 78.0–100.0)
Monocytes Relative: 5 % (ref 3–12)
Neutrophils Relative %: 67 % (ref 43–77)
Platelets: 333 10*3/uL (ref 150–400)
RBC: 4.45 MIL/uL (ref 3.87–5.11)
WBC: 11 10*3/uL — ABNORMAL HIGH (ref 4.0–10.5)

## 2013-08-11 LAB — COMPREHENSIVE METABOLIC PANEL
ALT: 18 U/L (ref 0–35)
Alkaline Phosphatase: 67 U/L (ref 39–117)
CO2: 25 mEq/L (ref 19–32)
GFR calc Af Amer: >90 mL/min (ref >90)
GFR calc non Af Amer: >90 mL/min (ref >90)
Glucose, Bld: 122 mg/dL — ABNORMAL HIGH (ref 70–99)
Potassium: 3.8 mEq/L (ref 3.5–5.1)
Sodium: 138 mEq/L (ref 135–145)

## 2013-08-11 MED ORDER — ALBUTEROL SULFATE HFA 108 (90 BASE) MCG/ACT IN AERS: 1.0000 | INHALATION_SPRAY | Freq: Four times a day (QID) | RESPIRATORY_TRACT | Status: DC | PRN

## 2013-08-11 MED ORDER — IOHEXOL 350 MG/ML SOLN
100.0000 mL | Freq: Once | INTRAVENOUS | Status: AC | PRN
  Administered 2013-08-11: 100 mL via INTRAVENOUS

## 2013-08-11 MED ORDER — PREDNISONE 10 MG PO TABS: ORAL_TABLET | ORAL | Status: DC

## 2013-08-11 NOTE — ED Provider Notes (Signed)
CSN: [id number]     Arrival date & time 08/11/13  1322 History   First MD Initiated Contact with Patient 08/11/13 1354     Chief Complaint  Patient presents with  . Shortness of Breath   (Consider location/radiation/quality/duration/timing/severity/associated sxs/prior Treatment) Patient is a 32 y.o. female presenting with shortness of breath. The history is provided by the patient. No language interpreter was used.  Shortness of Breath Severity:  Moderate Onset quality:  Gradual Timing:  Constant Progression:  Worsening Chronicity:  New Relieved by:  Nothing Worsened by:  Nothing tried Ineffective treatments:  None tried Associated symptoms: no abdominal pain   Risk factors: no hx of PE/DVT and no tobacco use   Risk factors comment:  Recent hormone therapy   Past Medical History  Diagnosis Date  . Asthma   . Fibromyalgia muscle pain   . Fibromyalgia   . Mental disorder   . Headache(784.0)     migraines  . Interstitial cystitis   . GERD (gastroesophageal reflux disease)   . Depression   . Abnormal Pap smear   . Normal pregnancy 05/16/2012  . SVD (spontaneous vaginal delivery) 05/17/2012  . PCOD (polycystic ovarian disease)    Past Surgical History  Procedure Laterality Date  . Breast reduction surgery    . Appendectomy    . Cholecystectomy     Family History  Problem Relation Age of Onset  . Hypertension Mother   . Asthma Mother   . Depression Mother   . Hypertension Maternal Grandmother   . Asthma Maternal Grandmother   . Heart attack Maternal Grandfather   . Heart disease Maternal Grandfather    History  Substance Use Topics  . Smoking status: Former Smoker    Quit date: 11/09/2008  . Smokeless tobacco: Never Used  . Alcohol Use: No   OB History   Grav Para Term Preterm Abortions TAB SAB Ect Mult Living   2 2 2  0 0 0 0 0 0 2     Review of Systems  Respiratory: Positive for shortness of breath.   Gastrointestinal: Negative for abdominal pain.  All  other systems reviewed and are negative.    Allergies  Sulfa antibiotics  Home Medications   Current Outpatient Rx  Name  Route  Sig  Dispense  Refill  . Cetirizine HCl (ZYRTEC PO)   Oral   Take by mouth.         . METFORMIN HCL PO   Oral   Take by mouth.         . Vitamin D, Ergocalciferol, (DRISDOL) 50000 UNITS CAPS capsule   Oral   Take 50,000 Units by mouth.         Marland Kitchen albuterol (PROVENTIL HFA;VENTOLIN HFA) 108 (90 BASE) MCG/ACT inhaler   Inhalation   Inhale 2 puffs into the lungs every 6 (six) hours as needed. For shortness of breath          . albuterol (PROVENTIL HFA;VENTOLIN HFA) 108 (90 BASE) MCG/ACT inhaler   Inhalation   Inhale 1-2 puffs into the lungs every 6 (six) hours as needed for wheezing.   1 Inhaler   0   . clindamycin (CLEOCIN) 150 MG capsule   Oral   Take 2 capsules (300 mg total) by mouth 3 (three) times daily.   21 capsule   0   . fluticasone (FLONASE) 50 MCG/ACT nasal spray   Nasal   Place 2 sprays into the nose daily.   16 g   0   .  oxyCODONE-acetaminophen (PERCOCET/ROXICET) 5-325 MG per tablet   Oral   Take 1-2 tablets by mouth every 6 (six) hours as needed for pain.   15 tablet   0   . pseudoephedrine (SUDAFED) 30 MG tablet   Oral   Take 30 mg by mouth at bedtime as needed. For congestion          BP 148/70  Pulse 103  Temp(Src) 97.8 F (36.6 C) (Oral)  Resp 18  Ht 5\' 4"  (1.626 m)  Wt 263 lb (119.296 kg)  BMI 45.12 kg/m2  SpO2 98%  LMP 07/25/2013 Physical Exam  Nursing note and vitals reviewed. Constitutional: She is oriented to person, place, and time. She appears well-developed and well-nourished.  HENT:  Head: Normocephalic.  Right Ear: External ear normal.  Left Ear: External ear normal.  Eyes: Conjunctivae and EOM are normal. Pupils are equal, round, and reactive to light.  Neck: Normal range of motion.  Cardiovascular: Normal rate.   Pulmonary/Chest: Effort normal and breath sounds normal.   Abdominal: Soft. Bowel sounds are normal.  Musculoskeletal: Normal range of motion.  Neurological: She is alert and oriented to person, place, and time. She has normal reflexes.  Skin: Skin is warm.  Psychiatric: She has a normal mood and affect.    ED Course  Procedures (including critical care time) Labs Review Labs Reviewed  CBC WITH DIFFERENTIAL - Abnormal; Notable for the following:    WBC 11.0 (*)    All other components within normal limits  COMPREHENSIVE METABOLIC PANEL - Abnormal; Notable for the following:    Glucose, Bld 122 (*)    Total Bilirubin 0.2 (*)    All other components within normal limits  PREGNANCY, URINE   Imaging Review Ct Angio Chest Pe W/cm &/or Wo Cm  08/11/2013   CLINICAL DATA:  Shortness of breath and chest pain  EXAM: CT ANGIOGRAPHY CHEST WITH CONTRAST  TECHNIQUE: Multidetector CT imaging of the chest was performed using the standard protocol during bolus administration of intravenous contrast. Multiplanar CT image reconstructions including MIPs were obtained to evaluate the vascular anatomy.  CONTRAST:  OMNIPAQUE IOHEXOL 350 MG/ML SOLN  COMPARISON:  None.  FINDINGS: The lungs are well aerated bilaterally without evidence of focal infiltrate or sizable effusion. The hilar and mediastinal structures show no significant lymphadenopathy. The thoracic inlet is within normal limits. The thoracic aorta is unremarkable.  The pulmonary artery is well visualized and demonstrates a normal branching pattern. No findings to suggest pulmonary embolism are identified. Scanning into the upper abdomen reveals no acute abnormality. No acute bony abnormality is noted.  Review of the MIP images confirms the above findings.  IMPRESSION: No evidence of pulmonary emboli. No acute abnormality is seen.   Electronically Signed   By: Alcide Clever M.D.   On: 08/11/2013 15:58    Date: 08/11/2013  Rate: 95  Rhythm: normal sinus rhythm  QRS Axis: normal  Intervals: normal  ST/T  Wave abnormalities: normal  Conduction Disutrbances:none  Narrative Interpretation:   Old EKG Reviewed: none available  MDM   1. Dyspnea     Pt reports she was treated with a hormone medication to start her period and is now on birth control and metformin for polycystic ovarian syndrome.   Pt reports she is getting progressively more short of breath.   Pt reports a history of asthma.   I will try pt on prednisone and albuterol.   Pt given primary care referrals   Elson Areas, PA-C  08/11/13 1632  Elson Areas, PA-C 08/16/13 1229

## 2013-08-11 NOTE — ED Notes (Addendum)
C/o SOB, intermittent central CP x 2 weeks

## 2013-08-11 NOTE — ED Notes (Signed)
Remains in radiology.

## 2013-08-17 NOTE — ED Provider Notes (Signed)
History/physical exam/procedure(s) were performed by non-physician practitioner and as supervising physician I was immediately available for consultation/collaboration. I have reviewed all notes and am in agreement with care and plan.   Hilario Quarry, MD 08/17/13 [phone]

## 2013-09-04 ENCOUNTER — Emergency Department (INDEPENDENT_AMBULATORY_CARE_PROVIDER_SITE_OTHER): Payer: Medicaid Other

## 2013-09-04 ENCOUNTER — Emergency Department (HOSPITAL_COMMUNITY)
Admission: EM | Admit: 2013-09-04 | Discharge: 2013-09-04 | Disposition: A | Discharge status: Home / Self Care | Payer: Medicaid Other | Source: Home / Self Care | Attending: Family Medicine | Admitting: Family Medicine

## 2013-09-04 ENCOUNTER — Encounter (HOSPITAL_COMMUNITY): Payer: Self-pay | Admitting: Emergency Medicine

## 2013-09-04 DIAGNOSIS — L600 Ingrowing nail: Secondary | ICD-10-CM

## 2013-09-04 MED ORDER — CIPROFLOXACIN HCL 500 MG PO TABS: 500.0000 mg | ORAL_TABLET | Freq: Two times a day (BID) | ORAL | Status: DC

## 2013-09-04 NOTE — ED Provider Notes (Signed)
CSN: [id number]     Arrival date & time 09/04/13  1931 History   First MD Initiated Contact with Patient 09/04/13 2058     Chief Complaint  Patient presents with  . Nail Problem    infection of right great toe x 2 months.   Marland Kitchen URI   (Consider location/radiation/quality/duration/timing/severity/associated sxs/prior Treatment) HPI Comments: PT reports R great toe infection with pus draining for 2 months. Saw a "wound" doctor a few weeks ago for same problem, put on augmentin. Finished entire course of antibiotics but infection persists. Pt reports pus draining from both sides of toenail, but more from lateral side. Soaks foot in epsom salt daily for 1 hour. Reports diabetes is under control.   Patient is a 32 y.o. female presenting with toe pain. The history is provided by the patient.  Toe Pain This is a chronic problem. Episode onset: 2 months ago. The problem occurs constantly. The problem has been gradually worsening. Exacerbated by: touching it. Nothing relieves the symptoms. Treatments tried: augmentin and soakign. The treatment provided no relief.    Past Medical History  Diagnosis Date  . Asthma   . Fibromyalgia muscle pain   . Fibromyalgia   . Mental disorder   . Headache(784.0)     migraines  . Interstitial cystitis   . GERD (gastroesophageal reflux disease)   . Depression   . Abnormal Pap smear   . Normal pregnancy 05/16/2012  . SVD (spontaneous vaginal delivery) 05/17/2012  . PCOD (polycystic ovarian disease)    Past Surgical History  Procedure Laterality Date  . Breast reduction surgery    . Appendectomy    . Cholecystectomy     Family History  Problem Relation Age of Onset  . Hypertension Mother   . Asthma Mother   . Depression Mother   . Hypertension Maternal Grandmother   . Asthma Maternal Grandmother   . Heart attack Maternal Grandfather   . Heart disease Maternal Grandfather    History  Substance Use Topics  . Smoking status: Former Smoker    Quit date:  11/09/2008  . Smokeless tobacco: Never Used  . Alcohol Use: No   OB History   Grav Para Term Preterm Abortions TAB SAB Ect Mult Living   2 2 2  0 0 0 0 0 0 2     Review of Systems  Constitutional: Negative for fever and chills.  Skin:       r toe infection    Allergies  Sulfa antibiotics  Home Medications   Current Outpatient Rx  Name  Route  Sig  Dispense  Refill  . albuterol (PROVENTIL HFA;VENTOLIN HFA) 108 (90 BASE) MCG/ACT inhaler   Inhalation   Inhale 1-2 puffs into the lungs every 6 (six) hours as needed for wheezing.   1 Inhaler   0   . METFORMIN HCL PO   Oral   Take by mouth.         . pseudoephedrine (SUDAFED) 30 MG tablet   Oral   Take 30 mg by mouth at bedtime as needed. For congestion         . Vitamin D, Ergocalciferol, (DRISDOL) 50000 UNITS CAPS capsule   Oral   Take 50,000 Units by mouth.         Marland Kitchen albuterol (PROVENTIL HFA;VENTOLIN HFA) 108 (90 BASE) MCG/ACT inhaler   Inhalation   Inhale 2 puffs into the lungs every 6 (six) hours as needed. For shortness of breath          .  albuterol (PROVENTIL HFA;VENTOLIN HFA) 108 (90 BASE) MCG/ACT inhaler   Inhalation   Inhale 1-2 puffs into the lungs every 6 (six) hours as needed for wheezing.   1 Inhaler   0   . Cetirizine HCl (ZYRTEC PO)   Oral   Take by mouth.         . ciprofloxacin (CIPRO) 500 MG tablet   Oral   Take 1 tablet (500 mg total) by mouth every 12 (twelve) hours.   20 tablet   0   . clindamycin (CLEOCIN) 150 MG capsule   Oral   Take 2 capsules (300 mg total) by mouth 3 (three) times daily.   21 capsule   0   . fluticasone (FLONASE) 50 MCG/ACT nasal spray   Nasal   Place 2 sprays into the nose daily.   16 g   0   . oxyCODONE-acetaminophen (PERCOCET/ROXICET) 5-325 MG per tablet   Oral   Take 1-2 tablets by mouth every 6 (six) hours as needed for pain.   15 tablet   0   . predniSONE (DELTASONE) 10 MG tablet      6,5,4,3,2,1 taper   21 tablet   0    BP 135/82   Pulse 112  Temp(Src) 98.5 F (36.9 C) (Oral)  Resp 20  SpO2 96%  LMP 08/21/2013  Breastfeeding? No Physical Exam  Constitutional: She appears well-developed and well-nourished. No distress.  Musculoskeletal:       Feet:  Skin: Skin is warm and dry. There is erythema.  See msk exam.     ED Course  Procedures (including critical care time) Labs Review Labs Reviewed - No data to display Imaging Review Dg Toe Great Right  09/04/2013   CLINICAL DATA:  Great toe pain and infection.  EXAM: RIGHT GREAT TOE  COMPARISON:  None.  FINDINGS: Osseous structures of the great toe are normal. No radiodense foreign body in the soft tissues. No findings suggestive of osteomyelitis.  IMPRESSION: Normal exam.   Electronically Signed   By: Geanie Cooley M.D.   On: 09/04/2013 20:46    EKG Interpretation     Ventricular Rate:    PR Interval:    QRS Duration:   QT Interval:    QTC Calculation:   R Axis:     Text Interpretation:              MDM   1. Ingrowing toenail with infection   Pt referred to podiatry. Rx cipro 500mg  BID #20. Pt to soak foot 2-3 times per day.      Cathlyn Parsons, NP 09/04/13 2112

## 2013-09-04 NOTE — ED Notes (Addendum)
Reports infection of right great toe x 2 months. States after finishing a two week course of augmentin symptoms returned shortly after. Having drainage from both sides of toe. Hx of DM  Also c/o cong. Body aches. Chills and yellow mucus with blowing nose. Denies fever, n/v/d on set last night. Pt has used allergy meds no relief.

## 2013-09-05 ENCOUNTER — Emergency Department (HOSPITAL_BASED_OUTPATIENT_CLINIC_OR_DEPARTMENT_OTHER)
Admission: EM | Admit: 2013-09-05 | Discharge: 2013-09-05 | Disposition: A | Discharge status: Home / Self Care | Payer: Medicaid Other | Attending: Emergency Medicine | Admitting: Emergency Medicine

## 2013-09-05 ENCOUNTER — Encounter (HOSPITAL_BASED_OUTPATIENT_CLINIC_OR_DEPARTMENT_OTHER): Payer: Self-pay | Admitting: Emergency Medicine

## 2013-09-05 DIAGNOSIS — R52 Pain, unspecified: Secondary | ICD-10-CM | POA: Insufficient documentation

## 2013-09-05 DIAGNOSIS — Z792 Long term (current) use of antibiotics: Secondary | ICD-10-CM | POA: Insufficient documentation

## 2013-09-05 DIAGNOSIS — Z79899 Other long term (current) drug therapy: Secondary | ICD-10-CM | POA: Insufficient documentation

## 2013-09-05 DIAGNOSIS — Z8719 Personal history of other diseases of the digestive system: Secondary | ICD-10-CM | POA: Insufficient documentation

## 2013-09-05 DIAGNOSIS — Z87891 Personal history of nicotine dependence: Secondary | ICD-10-CM | POA: Insufficient documentation

## 2013-09-05 DIAGNOSIS — J45909 Unspecified asthma, uncomplicated: Secondary | ICD-10-CM | POA: Insufficient documentation

## 2013-09-05 DIAGNOSIS — Z8639 Personal history of other endocrine, nutritional and metabolic disease: Secondary | ICD-10-CM | POA: Insufficient documentation

## 2013-09-05 DIAGNOSIS — Z8659 Personal history of other mental and behavioral disorders: Secondary | ICD-10-CM | POA: Insufficient documentation

## 2013-09-05 DIAGNOSIS — J069 Acute upper respiratory infection, unspecified: Secondary | ICD-10-CM | POA: Insufficient documentation

## 2013-09-05 DIAGNOSIS — Z862 Personal history of diseases of the blood and blood-forming organs and certain disorders involving the immune mechanism: Secondary | ICD-10-CM | POA: Insufficient documentation

## 2013-09-05 DIAGNOSIS — H9209 Otalgia, unspecified ear: Secondary | ICD-10-CM | POA: Insufficient documentation

## 2013-09-05 DIAGNOSIS — IMO0001 Reserved for inherently not codable concepts without codable children: Secondary | ICD-10-CM | POA: Insufficient documentation

## 2013-09-05 MED ORDER — OXYMETAZOLINE HCL 0.05 % NA SOLN
1.0000 | Freq: Once | NASAL | Status: AC
  Administered 2013-09-05: 1 via NASAL
  Filled 2013-09-05: qty 15

## 2013-09-05 NOTE — ED Provider Notes (Signed)
CSN: [id number]     Arrival date & time 09/05/13  1527 History   First MD Initiated Contact with Patient 09/05/13 1632     Chief Complaint  Patient presents with  . Sore Throat  . Generalized Body Aches  . Otalgia  . Nasal Congestion   (Consider location/radiation/quality/duration/timing/severity/associated sxs/prior Treatment) HPI Pt presents with c/o nasal congestion, diffuse body aches, sore throat and ear pain.  She states symptoms have been present for 2 days.  No fever.  No cough, no chest or abdominal pain. Denies dysuria.  No vomiting or diarrhea.  No specific sick contacts.  No recent travel.  She has been trying alleve, salt water gargles.  She has continued to drink liquids well.  Has had some decreased appetite.  There are no other associated systemic symptoms, there are no other alleviating or modifying factors.   Past Medical History  Diagnosis Date  . Asthma   . Fibromyalgia muscle pain   . Fibromyalgia   . Mental disorder   . Headache(784.0)     migraines  . Interstitial cystitis   . GERD (gastroesophageal reflux disease)   . Depression   . Abnormal Pap smear   . Normal pregnancy 05/16/2012  . SVD (spontaneous vaginal delivery) 05/17/2012  . PCOD (polycystic ovarian disease)    Past Surgical History  Procedure Laterality Date  . Breast reduction surgery    . Appendectomy    . Cholecystectomy     Family History  Problem Relation Age of Onset  . Hypertension Mother   . Asthma Mother   . Depression Mother   . Hypertension Maternal Grandmother   . Asthma Maternal Grandmother   . Heart attack Maternal Grandfather   . Heart disease Maternal Grandfather    History  Substance Use Topics  . Smoking status: Former Smoker    Quit date: 11/09/2008  . Smokeless tobacco: Never Used  . Alcohol Use: No   OB History   Grav Para Term Preterm Abortions TAB SAB Ect Mult Living   2 2 2  0 0 0 0 0 0 2     Review of Systems ROS reviewed and all otherwise negative  except for mentioned in HPI  Allergies  Sulfa antibiotics  Home Medications   Current Outpatient Rx  Name  Route  Sig  Dispense  Refill  . norgestimate-ethinyl estradiol (ORTHO-CYCLEN,SPRINTEC,PREVIFEM) 0.25-35 MG-MCG tablet   Oral   Take 1 tablet by mouth daily.         Marland Kitchen albuterol (PROVENTIL HFA;VENTOLIN HFA) 108 (90 BASE) MCG/ACT inhaler   Inhalation   Inhale 2 puffs into the lungs every 6 (six) hours as needed. For shortness of breath          . albuterol (PROVENTIL HFA;VENTOLIN HFA) 108 (90 BASE) MCG/ACT inhaler   Inhalation   Inhale 1-2 puffs into the lungs every 6 (six) hours as needed for wheezing.   1 Inhaler   0   . albuterol (PROVENTIL HFA;VENTOLIN HFA) 108 (90 BASE) MCG/ACT inhaler   Inhalation   Inhale 1-2 puffs into the lungs every 6 (six) hours as needed for wheezing.   1 Inhaler   0   . Cetirizine HCl (ZYRTEC PO)   Oral   Take by mouth.         . ciprofloxacin (CIPRO) 500 MG tablet   Oral   Take 1 tablet (500 mg total) by mouth every 12 (twelve) hours.   20 tablet   0   . clindamycin (CLEOCIN) 150  MG capsule   Oral   Take 2 capsules (300 mg total) by mouth 3 (three) times daily.   21 capsule   0   . fluticasone (FLONASE) 50 MCG/ACT nasal spray   Nasal   Place 2 sprays into the nose daily.   16 g   0   . METFORMIN HCL PO   Oral   Take 500 mg by mouth 2 (two) times daily.          Marland Kitchen oxyCODONE-acetaminophen (PERCOCET/ROXICET) 5-325 MG per tablet   Oral   Take 1-2 tablets by mouth every 6 (six) hours as needed for pain.   15 tablet   0   . predniSONE (DELTASONE) 10 MG tablet      6,5,4,3,2,1 taper   21 tablet   0   . pseudoephedrine (SUDAFED) 30 MG tablet   Oral   Take 30 mg by mouth at bedtime as needed. For congestion         . Vitamin D, Ergocalciferol, (DRISDOL) 50000 UNITS CAPS capsule   Oral   Take 50,000 Units by mouth.          BP 126/69  Pulse 98  Temp(Src) 98.8 F (37.1 C) (Oral)  Resp 16  Ht 5\' 4"   (1.626 m)  Wt 260 lb (117.935 kg)  BMI 44.61 kg/m2  SpO2 98%  LMP 08/21/2013  Breastfeeding? No Vitals reviewed Physical Exam Physical Examination: General appearance - alert, well appearing, and in no distress Mental status - alert, oriented to person, place, and time Eyes - no conjunctival injection, no scleral icterus Ears - bilateral TM's and external ear canals normal Mouth - mucous membranes moist, pharynx normal without lesions, mild erythema of posterior OP, no exudates, palate symmetric, uvula midline Neck - supple, no significant adenopathy Chest - clear to auscultation, no wheezes, rales or rhonchi, symmetric air entry Heart - normal rate, regular rhythm, normal S1, S2, no murmurs, rubs, clicks or gallops Abdomen - soft, nontender, nondistended, no masses or organomegaly Extremities - peripheral pulses normal, no pedal edema, no clubbing or cyanosis Skin - normal coloration and turgor, no rashes  ED Course  Procedures (including critical care time) Labs Review Labs Reviewed  RAPID STREP SCREEN  CULTURE, GROUP A STREP   Imaging Review Dg Toe Great Right  09/04/2013   CLINICAL DATA:  Great toe pain and infection.  EXAM: RIGHT GREAT TOE  COMPARISON:  None.  FINDINGS: Osseous structures of the great toe are normal. No radiodense foreign body in the soft tissues. No findings suggestive of osteomyelitis.  IMPRESSION: Normal exam.   Electronically Signed   By: Geanie Cooley M.D.   On: 09/04/2013 20:46    EKG Interpretation   None       MDM   1. URI (upper respiratory infection)    Pt presenting with c/o nasal congestion, sore throat, ear pain.  Rapid strep negative, pt counseled about throat culure pending.  Given afrin for nasal congestion.  likley viral URI- no evidence of bacterial infection at this  Time.  Counseled patient about symptom management.  She will start taking sudafed for congestion as well.  Discharged with strict return precautions.  Pt agreeable with  plan.    Ethelda Chick, MD 09/05/13 1726

## 2013-09-05 NOTE — ED Notes (Signed)
Sinus congestion, sore throat, body aches, "ears sore" x2 days. Denies fever.

## 2013-09-12 NOTE — ED Provider Notes (Signed)
Medical screening examination/treatment/procedure(s) were performed by resident physician or non-physician practitioner and as supervising physician I was immediately available for consultation/collaboration.   Barkley Bruns MD.   Linna Hoff, MD 09/12/13 1011

## 2013-12-20 ENCOUNTER — Emergency Department (HOSPITAL_BASED_OUTPATIENT_CLINIC_OR_DEPARTMENT_OTHER)
Admission: EM | Admit: 2013-12-20 | Discharge: 2013-12-20 | Disposition: A | Discharge status: Home / Self Care | Payer: BC Managed Care – PPO | Attending: Emergency Medicine | Admitting: Emergency Medicine

## 2013-12-20 ENCOUNTER — Encounter (HOSPITAL_BASED_OUTPATIENT_CLINIC_OR_DEPARTMENT_OTHER): Payer: Self-pay | Admitting: Emergency Medicine

## 2013-12-20 DIAGNOSIS — Z792 Long term (current) use of antibiotics: Secondary | ICD-10-CM | POA: Insufficient documentation

## 2013-12-20 DIAGNOSIS — Z8659 Personal history of other mental and behavioral disorders: Secondary | ICD-10-CM | POA: Insufficient documentation

## 2013-12-20 DIAGNOSIS — Z87891 Personal history of nicotine dependence: Secondary | ICD-10-CM | POA: Insufficient documentation

## 2013-12-20 DIAGNOSIS — R05 Cough: Secondary | ICD-10-CM | POA: Insufficient documentation

## 2013-12-20 DIAGNOSIS — J3489 Other specified disorders of nose and nasal sinuses: Secondary | ICD-10-CM | POA: Insufficient documentation

## 2013-12-20 DIAGNOSIS — IMO0001 Reserved for inherently not codable concepts without codable children: Secondary | ICD-10-CM | POA: Insufficient documentation

## 2013-12-20 DIAGNOSIS — Z79899 Other long term (current) drug therapy: Secondary | ICD-10-CM | POA: Insufficient documentation

## 2013-12-20 DIAGNOSIS — Z8639 Personal history of other endocrine, nutritional and metabolic disease: Secondary | ICD-10-CM | POA: Insufficient documentation

## 2013-12-20 DIAGNOSIS — Z8719 Personal history of other diseases of the digestive system: Secondary | ICD-10-CM | POA: Insufficient documentation

## 2013-12-20 DIAGNOSIS — R112 Nausea with vomiting, unspecified: Secondary | ICD-10-CM | POA: Insufficient documentation

## 2013-12-20 DIAGNOSIS — R6889 Other general symptoms and signs: Secondary | ICD-10-CM | POA: Insufficient documentation

## 2013-12-20 DIAGNOSIS — Z862 Personal history of diseases of the blood and blood-forming organs and certain disorders involving the immune mechanism: Secondary | ICD-10-CM | POA: Insufficient documentation

## 2013-12-20 DIAGNOSIS — J45909 Unspecified asthma, uncomplicated: Secondary | ICD-10-CM | POA: Insufficient documentation

## 2013-12-20 DIAGNOSIS — R059 Cough, unspecified: Secondary | ICD-10-CM | POA: Insufficient documentation

## 2013-12-20 DIAGNOSIS — H6692 Otitis media, unspecified, left ear: Secondary | ICD-10-CM

## 2013-12-20 DIAGNOSIS — J029 Acute pharyngitis, unspecified: Secondary | ICD-10-CM | POA: Insufficient documentation

## 2013-12-20 DIAGNOSIS — H6690 Otitis media, unspecified, unspecified ear: Secondary | ICD-10-CM | POA: Insufficient documentation

## 2013-12-20 MED ORDER — AMOXICILLIN 500 MG PO CAPS: 500.0000 mg | ORAL_CAPSULE | Freq: Two times a day (BID) | ORAL | Status: AC

## 2013-12-20 MED ORDER — OXYCODONE-ACETAMINOPHEN 5-325 MG PO TABS
1.0000 | ORAL_TABLET | Freq: Once | ORAL | Status: AC
  Administered 2013-12-20: 1 via ORAL
  Filled 2013-12-20: qty 1

## 2013-12-20 MED ORDER — ONDANSETRON 4 MG PO TBDP
4.0000 mg | ORAL_TABLET | Freq: Once | ORAL | Status: AC
  Administered 2013-12-20: 4 mg via ORAL
  Filled 2013-12-20: qty 1

## 2013-12-20 MED ORDER — ANTIPYRINE-BENZOCAINE 5.4-1.4 % OT SOLN
3.0000 [drp] | OTIC | Status: DC | PRN
  Administered 2013-12-20: 4 [drp] via OTIC

## 2013-12-20 MED ORDER — ANTIPYRINE-BENZOCAINE 5.4-1.4 % OT SOLN
OTIC | Status: AC
  Administered 2013-12-20: 4 [drp] via OTIC
  Filled 2013-12-20: qty 10

## 2013-12-20 MED ORDER — AMOXICILLIN 500 MG PO CAPS
500.0000 mg | ORAL_CAPSULE | Freq: Once | ORAL | Status: AC
  Administered 2013-12-20: 500 mg via ORAL
  Filled 2013-12-20: qty 1

## 2013-12-20 NOTE — ED Notes (Signed)
MD at bedside. 

## 2013-12-20 NOTE — Discharge Instructions (Signed)
Take antibiotics for ear infection - take for full dose  Return to the emergency department if you develop any changing/worsening condition, fever, severe headache, stiff neck, ear drainage, facial swelling, or any other concerns (please read additional information regarding your condition below)   Otitis Media, Adult Otitis media is redness, soreness, and swelling (inflammation) of the middle ear. Otitis media may be caused by allergies or, most commonly, by infection. Often it occurs as a complication of the common cold. SIGNS AND SYMPTOMS Symptoms of otitis media may include:  Earache.  Fever.  Ringing in your ear.  Headache.  Leakage of fluid from the ear. DIAGNOSIS To diagnose otitis media, your health care provider will examine your ear with an otoscope. This is an instrument that allows your health care provider to see into your ear in order to examine your eardrum. Your health care provider also will ask you questions about your symptoms. TREATMENT  Typically, otitis media resolves on its own within 3 5 days. Your health care provider may prescribe medicine to ease your symptoms of pain. If otitis media does not resolve within 5 days or is recurrent, your health care provider may prescribe antibiotic medicines if he or she suspects that a bacterial infection is the cause. HOME CARE INSTRUCTIONS   Take your medicine as directed until it is gone, even if you feel better after the first few days.  Only take over-the-counter or prescription medicines for pain, discomfort, or fever as directed by your health care provider.  Follow up with your health care provider as directed. SEEK MEDICAL CARE IF:  You have otitis media only in one ear or bleeding from your nose or both.  You notice a lump on your neck.  You are not getting better in 3 5 days.  You feel worse instead of better. SEEK IMMEDIATE MEDICAL CARE IF:   You have pain that is not controlled with medicine.  You  have swelling, redness, or pain around your ear or stiffness in your neck.  You notice that part of your face is paralyzed.  You notice that the bone behind your ear (mastoid) is tender when you touch it. MAKE SURE YOU:   Understand these instructions.  Will watch your condition.  Will get help right away if you are not doing well or get worse. Document Released: 07/31/2004 Document Revised: 08/16/2013 Document Reviewed: 05/23/2013 [facility] Patient Information 2014 [place], [place].   Emergency Department Resource Guide 1) Find a Doctor and Pay Out of Pocket Although you won't have to find out who is covered by your insurance plan, it is a good idea to ask around and get recommendations. You will then need to call the office and see if the doctor you have chosen will accept you as a new patient and what types of options they offer for patients who are self-pay. Some doctors offer discounts or will set up payment plans for their patients who do not have insurance, but you will need to ask so you aren't surprised when you get to your appointment.  2) Contact Your Local Health Department Not all health departments have doctors that can see patients for sick visits, but many do, so it is worth a call to see if yours does. If you don't know where your local health department is, you can check in your phone book. The CDC also has a tool to help you locate your state's health department, and many state websites also have listings of all of their  local health departments.  3) Find a Walk-in Clinic If your illness is not likely to be very severe or complicated, you may want to try a walk in clinic. These are popping up all over the country in pharmacies, drugstores, and shopping centers. They're usually staffed by nurse practitioners or physician assistants that have been trained to treat common illnesses and complaints. They're usually fairly quick and inexpensive. However, if you have serious medical  issues or chronic medical problems, these are probably not your best option.  No Primary Care Doctor: - Call Health Connect at  [phone] - they can help you locate a primary care doctor that  accepts your insurance, provides certain services, etc. - Physician Referral Service- [phone]  Chronic Pain Problems: Organization         Address  Phone   Notes  [facility]  [phone] Patients need to be referred by their primary care doctor.   Medication Assistance: Organization         Address  Phone   Notes  [facility] [address] [place], [place] [address] [phone] --Must be a resident of [facility] -- Must have NO insurance coverage whatsoever (no Medicaid/ Medicare, etc.) -- The pt. MUST have a primary care doctor that directs their care regularly and follows them in the community   Med[facility]ist  [phone]   [employer]  [phone]) [phone]    Agencies that provide inexpensive medical care: Organization         Address  Phone   Notes  [facility]  [phone]   [facility]    [phone]   [facility] [address] [place], [place] [address] [phone]   [facility] [address], [place] [phone]   [facility]    [phone]   [facility]    [phone]   [facility]  [address], [place] Phone:  [phone], Fax:  [phone] Hours of Operation:  9 am - 6 pm, M-F.  Also accepts Medicaid/Medicare and self-pay.  [facility]  [address], [place] Phone: [phone], Fax: [phone]. Hours of Operation:  8:30 am - 5:30 pm, M-F.  Also accepts Medicaid and self-pay.  [facility] [place] [address], [place] Phone: [phone]   [facility] [address], [place], [place]  [phone], Ext. [phone] Mondays & Thursdays: 7-9 AM.  First 15 patients are seen on a first come, first serve basis.    Medicaid-accepting [facility] Providers:  Organization         Address  Phone   Notes  [facility] [address], [place] [phone] Also accepts self-pay patients.  [facility] [address], [place]  [phone]   [facility] [address], [place] [phone]   [facility] [address] [place] [address], [place] [phone]   Renaye RakersVeita Bland [address], [place]   [phone] Only accepts [place] Access [place] patients after they have their name applied to their card.   Self-Pay (no insurance) in [facility]:  Organization         Address  Phone   Notes  Sickle Cell Patients,  [facility] [address], [place] [phone]   °[facility] [address], [place] [phone]   °[facility] ° [address], [place] [phone]   °[facility]/Dr. Osei-Bonsu ° [address] [place] [address], [place] or [address], [place] [phone] Phone number for both [place] and [place] locations is the same.  °[facility] [address], [place] [phone]   °[facility] [place] [address] [place] [address], [place] or [address] [phone] °[phone]   °[facility] [address], [place] [phone], phone; [phone], fax Sees patients 1st and 3rd Saturday of every month.  Must not qualify for public or private insurance (i.e. Medicaid, Medicare, [place] Health Choice, Veterans' Benefits) • Household income should be no more than 200% of the poverty level •The clinic cannot treat you if you are pregnant or think you are pregnant • Sexually transmitted  diseases are not treated at the clinic.  ° ° °Dental Care: °Organization         Address  Phone  Notes  °[facility] [address], [place] [phone] Accepts children up to age 21 who are enrolled in Medicaid or [place] Health Choice; pregnant women with a Medicaid card; and children who have applied for Medicaid or [place] Health Choice, but were declined, whose parents can pay a reduced fee at time of service.  °[facility] [place]  [address], [place] [phone] Accepts children up to age 21 who are enrolled in Medicaid or [place] Health Choice; pregnant women with a Medicaid card; and children who have applied for Medicaid or [place] Health Choice, but were declined, whose parents can pay a reduced fee at time of service.  °[facility] ° [address], [place] [phone] Patients are seen by appointment only. Walk-ins are not accepted. [facility] will see patients 18 years of age and older. °Monday - Tuesday (8am-5pm) °Most Wednesdays (8:30-5pm) °$30 per visit, cash only  °[facility] ° [address], [place] [phone] Patients are seen by appointment only. Walk-ins are not accepted. [facility] will see patients 18 years of age and older. °One Wednesday Evening (Monthly: Volunteer Based).  $30 per visit, cash only  °[facility]  [phone] for adults; Children under age 4, call Graduate Pediatric Dentistry at [phone]. Children aged 4-14, please call [phone] to request a pediatric application. ° Dental services are provided in all areas of dental care including fillings, crowns and bridges, complete and partial dentures, implants, gum treatment, root canals, and extractions. Preventive care is also provided. Treatment is provided to both adults and children. °Patients are selected via a  lottery and there is often a waiting list. °  °[facility] [address], ° ° [phone] [email or web address] °  °[facility] [address], [place], [place] [phone], Ext. [phone] Second and Fourth Thursday of each month, opens at 6:30 AM; Clinic ends at 9 AM.  Patients are seen on a first-come first-served basis, and a limited number are seen during each clinic.  ° °[facility] ° [address], [place], [place] [phone]   Eligibility Requirements °You must   have lived in [place], [place], or [place] counties for at least the last three months.   You cannot be eligible for state or federal sponsored [employer], including [employer], [place], or [employer].   You generally cannot be eligible for healthcare insurance through your employer.    How to apply: Eligibility screenings are held every Tuesday and Wednesday afternoon from 1:00 pm until 4:00 pm. You do not need an appointment for the interview!  [facility] [address] [place], [place]   [place]  [place] Department  [place]  [phone]    Behavioral Health Resources in the Community: Intensive Outpatient Programs Organization         Address  Phone  Notes  [place], [place], [place] [phone]   [facility] [address], [place], [place]   [facility]: [facility] Drug [facility] [address], [place], [place]   [place] [address]  [place], [place] or [phone]   Substance Abuse Resources Organization         Address  Phone  Notes  [facility]  [phone]   [place]  [phone]   [facility] [place]   Chinita Pester  [phone]   Residential &  Outpatient Substance Abuse Program  [phone]   Psychological Services Organization         Address  Phone  Notes  [facility]  [place]  [phone]   [place] [address], [place] or [phone]    Mobile Crisis Teams Organization         Address  Phone  Notes  [facility], Mobile Crisis Care Unit  [phone]   [facility]  [address] [place], [place]   [place] [address], [place] [phone]    Self-Help/Support Groups Organization         Address  Phone             Notes  [place]. of [place] - variety of support groups  [place] Call for more information  Narcotics Anonymous (NA), [facility] [address], [employer] [place]  2 meetings at this location   Special educational needs teacher         Address  Phone  Notes  [facility] [place],    [place]  [phone]   [facility]  [address] [place] [phone], [place], [place]   [place], [place] [phone] Admissions: 8am-3pm M-F  [facility] [place] [address]    [place], [place] [phone]   [facility] [address] [place], [place], [place]   [facility] [address],  [place], [place]   [facility] - Intensive Outpatient [address], [place], [place]   [facility] ([place] [address] [place].,  [place], [place] or [phone]   [facility] ([facility]) [address] [place], [place] Accepts Medicaid  [facility] [place] [address]  [place] [phone] Substance Abuse/Addiction Treatment   [facility] Resources Organization          Address  Phone  Notes  [facility]  [phone]   Domenic Schwab, PhD [address] Arlis Porta [place], [place]   [phone] or [phone]   [place], [place] [phone]   [address], [place], [place] [phone] Insurance/Medicaid/sponsorship through [facility] [address]                                    [place], [place] [phone] [place] [address], [place] [phone]    Dr. Adele Schilder  [phone]   [facility] Dept. 1) [address] [place] [address] [place] 2) [place] 3)  [place] [address], [place] [phone]  [phone]   [place] [phone] or [phone] (After Hours)

## 2013-12-20 NOTE — ED Provider Notes (Signed)
CSN: [id number]     Arrival date & time 12/20/13  1746 History   First MD Initiated Contact with Patient 12/20/13 1802     Chief Complaint  Patient presents with  . Otalgia   HPI  [name] is a 33 y.o. female with a PMH of asthma, fibromyalgia, headaches, GERD, depression, and PCOS who presents to the ED for evaluation of oltalgia.  History was provided by the patient. Patient states she was around some dogs 4 days ago and she developed allergies. She states she has had nasal congestion, rhinorrhea, sinus pressure, sneezing, and a "scratchy" throat ever since. She states that today she had gradually worsening left ear pain. She has decreased hearing in the left ear and mild tinnitus. She took an Advil with no relief. She has a hx of ear infections as a child and a few throughout the years. No facial or head trauma. She states she had mild cough, which started today with a few episodes of post-tussive emesis. No abdominal pain, diarrhea, constipation or dysuria. Cough is non-productive. No wheezing, SOB, chest pain, or dyspnea.    Past Medical History  Diagnosis Date  . Asthma   . Fibromyalgia muscle pain   . Fibromyalgia   . Mental disorder   . Headache(784.0)     migraines  . Interstitial cystitis   . GERD (gastroesophageal reflux disease)   . Depression   . Abnormal Pap smear   . Normal pregnancy 05/16/2012  . SVD (spontaneous vaginal delivery) 05/17/2012  . PCOD (polycystic ovarian disease)    Past Surgical History  Procedure Laterality Date  . Breast reduction surgery    . Appendectomy    . Cholecystectomy     Family History  Problem Relation Age of Onset  . Hypertension Mother   . Asthma Mother   . Depression Mother   . Hypertension Maternal Grandmother   . Asthma Maternal Grandmother   . Heart attack Maternal Grandfather   . Heart disease Maternal Grandfather    History  Substance Use Topics  . Smoking status: Former Smoker    Quit date: 11/09/2008  . Smokeless  tobacco: Never Used  . Alcohol Use: No   OB History   Grav Para Term Preterm Abortions TAB SAB Ect Mult Living   2 2 2  0 0 0 0 0 0 2     Review of Systems  Constitutional: Negative for fever, chills, diaphoresis, activity change, appetite change and fatigue.  HENT: Positive for congestion, ear pain, hearing loss, postnasal drip, rhinorrhea, sinus pressure, sneezing, sore throat and tinnitus. Negative for ear discharge, facial swelling and trouble swallowing.   Eyes: Negative for discharge.  Respiratory: Positive for cough. Negative for chest tightness, shortness of breath and wheezing.   Cardiovascular: Negative for chest pain and leg swelling.  Gastrointestinal: Positive for nausea and vomiting (post-tussive). Negative for abdominal pain, diarrhea and constipation.  Genitourinary: Negative for dysuria.  Musculoskeletal: Negative for back pain, myalgias and neck pain.  Neurological: Negative for dizziness, weakness, light-headedness and headaches.    Allergies  Sulfa antibiotics  Home Medications   Current Outpatient Rx  Name  Route  Sig  Dispense  Refill  . albuterol (PROVENTIL HFA;VENTOLIN HFA) 108 (90 BASE) MCG/ACT inhaler   Inhalation   Inhale 2 puffs into the lungs every 6 (six) hours as needed. For shortness of breath          . albuterol (PROVENTIL HFA;VENTOLIN HFA) 108 (90 BASE) MCG/ACT inhaler   Inhalation  Inhale 1-2 puffs into the lungs every 6 (six) hours as needed for wheezing.   1 Inhaler   0   . albuterol (PROVENTIL HFA;VENTOLIN HFA) 108 (90 BASE) MCG/ACT inhaler   Inhalation   Inhale 1-2 puffs into the lungs every 6 (six) hours as needed for wheezing.   1 Inhaler   0   . Cetirizine HCl (ZYRTEC PO)   Oral   Take by mouth.         . ciprofloxacin (CIPRO) 500 MG tablet   Oral   Take 1 tablet (500 mg total) by mouth every 12 (twelve) hours.   20 tablet   0   . clindamycin (CLEOCIN) 150 MG capsule   Oral   Take 2 capsules (300 mg total) by mouth  3 (three) times daily.   21 capsule   0   . fluticasone (FLONASE) 50 MCG/ACT nasal spray   Nasal   Place 2 sprays into the nose daily.   16 g   0   . METFORMIN HCL PO   Oral   Take 500 mg by mouth 2 (two) times daily.          . norgestimate-ethinyl estradiol (ORTHO-CYCLEN,SPRINTEC,PREVIFEM) 0.25-35 MG-MCG tablet   Oral   Take 1 tablet by mouth daily.         Marland Kitchen oxyCODONE-acetaminophen (PERCOCET/ROXICET) 5-325 MG per tablet   Oral   Take 1-2 tablets by mouth every 6 (six) hours as needed for pain.   15 tablet   0   . predniSONE (DELTASONE) 10 MG tablet      6,5,4,3,2,1 taper   21 tablet   0   . pseudoephedrine (SUDAFED) 30 MG tablet   Oral   Take 30 mg by mouth at bedtime as needed. For congestion         . Vitamin D, Ergocalciferol, (DRISDOL) 50000 UNITS CAPS capsule   Oral   Take 50,000 Units by mouth.          BP 143/99  Pulse 115  Temp(Src) 99.4 F (37.4 C) (Oral)  Ht 5\' 4"  (1.626 m)  Wt 253 lb (114.76 kg)  BMI 43.41 kg/m2  SpO2 98%  Filed Vitals:   12/20/13 1750 12/20/13 2014  BP: 143/99 125/71  Pulse: 115 102  Temp: 99.4 F (37.4 C)   TempSrc: Oral   Resp:  18  Height: 5\' 4"  (1.626 m)   Weight: 253 lb (114.76 kg)   SpO2: 98% 100%    Physical Exam  Nursing note and vitals reviewed. Constitutional: She is oriented to person, place, and time. She appears well-developed and well-nourished. No distress.  HENT:  Head: Normocephalic and atraumatic.  Right Ear: External ear normal.  Left Ear: External ear normal.  Nose: Nose normal.  Mouth/Throat: Oropharynx is clear and moist. No oropharyngeal exudate.  Left TM with erythema and without bulging or perforation. No mastoid tenderness or bulging bilaterally. No tragal tenderness bilaterally. Right TM gray and translucent. Finger rub test without hearing loss. Nasal congestion. No erythema to the posterior pharynx. Tonsils without edema or exudates. Uvula midline. No trismus. No difficulty  controlling secretions. No tenderness to palpation to the sinuses throughout.  Eyes: Conjunctivae are normal. Pupils are equal, round, and reactive to light. Right eye exhibits no discharge. Left eye exhibits no discharge.  Neck: Normal range of motion. Neck supple.  No cervical spinal or paraspinal tenderness to palpation throughout.  No limitations with neck ROM. No LAD  Cardiovascular: Normal rate, regular rhythm and  normal heart sounds.  Exam reveals no gallop and no friction rub.   No murmur heard. Pulmonary/Chest: Effort normal and breath sounds normal. No respiratory distress. She has no wheezes. She has no rales. She exhibits no tenderness.  Abdominal: Soft. She exhibits no distension and no mass. There is no tenderness. There is no rebound and no guarding.  Musculoskeletal: Normal range of motion. She exhibits no edema and no tenderness.  Patient able to ambulate without difficulty or ataxia.   Neurological: She is alert and oriented to person, place, and time.  Skin: Skin is warm and dry. She is not diaphoretic.    ED Course  Procedures (including critical care time) Labs Review Labs Reviewed - No data to display Imaging Review No results found.  EKG Interpretation   None       MDM   Nelly RoutSarah E Googe is a 33 y.o. female with a PMH of asthma, fibromyalgia, headaches, GERD, depression, and PCOS who presents to the ED for evaluation of oltalgia  Rechecks  8:00 PM = Patient states she feels better. Ready for discharge. Husband driving her home.    Etiology of otalgia likely due to otitis media. Patient afebrile and non-toxic. Instructed to follow-up with PCP. Return precautions, discharge instructions, and follow-up was discussed with the patient before discharge.     Discharge Medication List as of 12/20/2013  8:09 PM    START taking these medications   Details  amoxicillin (AMOXIL) 500 MG capsule Take 1 capsule (500 mg total) by mouth 2 (two) times daily., Starting  12/20/2013, Last dose on Wed 12/27/13, Print        Final impressions: 1. Otitis media, left       Greer EeJessica Katlin Nichole Neyer PA-C           Jillyn LedgerJessica K Caedyn Raygoza, PA-C 12/21/13 1246

## 2013-12-20 NOTE — ED Notes (Signed)
Pt c/o " severe" left ear pain x 3 hrs

## 2013-12-23 NOTE — ED Provider Notes (Signed)
Medical screening examination/treatment/procedure(s) were performed by non-physician practitioner and as supervising physician I was immediately available for consultation/collaboration.  EKG Interpretation   None        Daksha Koone K Linker, MD 12/23/13 0715 

## 2014-03-21 ENCOUNTER — Emergency Department (HOSPITAL_BASED_OUTPATIENT_CLINIC_OR_DEPARTMENT_OTHER)
Admission: EM | Admit: 2014-03-21 | Discharge: 2014-03-21 | Disposition: A | Discharge status: Home / Self Care | Payer: BC Managed Care – PPO | Attending: Emergency Medicine | Admitting: Emergency Medicine

## 2014-03-21 ENCOUNTER — Encounter (HOSPITAL_BASED_OUTPATIENT_CLINIC_OR_DEPARTMENT_OTHER): Payer: Self-pay | Admitting: Emergency Medicine

## 2014-03-21 DIAGNOSIS — Z79899 Other long term (current) drug therapy: Secondary | ICD-10-CM | POA: Insufficient documentation

## 2014-03-21 DIAGNOSIS — J45909 Unspecified asthma, uncomplicated: Secondary | ICD-10-CM | POA: Insufficient documentation

## 2014-03-21 DIAGNOSIS — IMO0001 Reserved for inherently not codable concepts without codable children: Secondary | ICD-10-CM | POA: Insufficient documentation

## 2014-03-21 DIAGNOSIS — IMO0002 Reserved for concepts with insufficient information to code with codable children: Secondary | ICD-10-CM | POA: Insufficient documentation

## 2014-03-21 DIAGNOSIS — I10 Essential (primary) hypertension: Secondary | ICD-10-CM | POA: Insufficient documentation

## 2014-03-21 DIAGNOSIS — Z8719 Personal history of other diseases of the digestive system: Secondary | ICD-10-CM | POA: Insufficient documentation

## 2014-03-21 DIAGNOSIS — R072 Precordial pain: Secondary | ICD-10-CM | POA: Insufficient documentation

## 2014-03-21 DIAGNOSIS — R079 Chest pain, unspecified: Secondary | ICD-10-CM

## 2014-03-21 DIAGNOSIS — Z3202 Encounter for pregnancy test, result negative: Secondary | ICD-10-CM | POA: Insufficient documentation

## 2014-03-21 DIAGNOSIS — Z792 Long term (current) use of antibiotics: Secondary | ICD-10-CM | POA: Insufficient documentation

## 2014-03-21 DIAGNOSIS — Z87891 Personal history of nicotine dependence: Secondary | ICD-10-CM | POA: Insufficient documentation

## 2014-03-21 DIAGNOSIS — Z8639 Personal history of other endocrine, nutritional and metabolic disease: Secondary | ICD-10-CM | POA: Insufficient documentation

## 2014-03-21 DIAGNOSIS — Z8659 Personal history of other mental and behavioral disorders: Secondary | ICD-10-CM | POA: Insufficient documentation

## 2014-03-21 DIAGNOSIS — Z862 Personal history of diseases of the blood and blood-forming organs and certain disorders involving the immune mechanism: Secondary | ICD-10-CM | POA: Insufficient documentation

## 2014-03-21 HISTORY — DX: Essential (primary) hypertension: I10

## 2014-03-21 LAB — URINALYSIS, ROUTINE W REFLEX MICROSCOPIC
Bilirubin Urine: NEGATIVE
GLUCOSE, UA: NEGATIVE mg/dL
Hgb urine dipstick: NEGATIVE
Ketones, ur: NEGATIVE mg/dL
Nitrite: NEGATIVE
PH: 5.5 (ref 5.0–8.0)
Protein, ur: NEGATIVE mg/dL
Specific Gravity, Urine: 1.025 (ref 1.005–1.030)
Urobilinogen, UA: 0.2 mg/dL (ref 0.0–1.0)

## 2014-03-21 LAB — CBC WITH DIFFERENTIAL/PLATELET
Basophils Absolute: 0 10*3/uL (ref 0.0–0.1)
Basophils Relative: 0 % (ref 0–1)
EOS ABS: 0.1 10*3/uL (ref 0.0–0.7)
Eosinophils Relative: 1 % (ref 0–5)
HCT: 40.1 % (ref 36.0–46.0)
Hemoglobin: 13.9 g/dL (ref 12.0–15.0)
LYMPHS PCT: 23 % (ref 12–46)
Lymphs Abs: 2.6 10*3/uL (ref 0.7–4.0)
MCH: 29.8 pg (ref 26.0–34.0)
MCHC: 34.7 g/dL (ref 30.0–36.0)
MCV: 86.1 fL (ref 78.0–100.0)
Monocytes Absolute: 0.4 10*3/uL (ref 0.1–1.0)
Monocytes Relative: 4 % (ref 3–12)
NEUTROS PCT: 72 % (ref 43–77)
Neutro Abs: 8 10*3/uL — ABNORMAL HIGH (ref 1.7–7.7)
Platelets: 338 10*3/uL (ref 150–400)
RBC: 4.66 MIL/uL (ref 3.87–5.11)
RDW: 13.3 % (ref 11.5–15.5)
WBC: 11 10*3/uL — ABNORMAL HIGH (ref 4.0–10.5)

## 2014-03-21 LAB — COMPREHENSIVE METABOLIC PANEL
ALT: 16 U/L (ref 0–35)
AST: 14 U/L (ref 0–37)
Albumin: 4 g/dL (ref 3.5–5.2)
Alkaline Phosphatase: 66 U/L (ref 39–117)
BUN: 13 mg/dL (ref 6–23)
CO2: 24 mEq/L (ref 19–32)
Calcium: 9.7 mg/dL (ref 8.4–10.5)
Chloride: 103 mEq/L (ref 96–112)
Creatinine, Ser: 0.7 mg/dL (ref 0.50–1.10)
GFR calc Af Amer: >90 mL/min (ref >90)
GFR calc non Af Amer: >90 mL/min (ref >90)
GLUCOSE: 111 mg/dL — AB (ref 70–99)
POTASSIUM: 4 meq/L (ref 3.7–5.3)
SODIUM: 140 meq/L (ref 137–147)
TOTAL PROTEIN: 7.8 g/dL (ref 6.0–8.3)
Total Bilirubin: 0.4 mg/dL (ref 0.3–1.2)

## 2014-03-21 LAB — LIPASE, BLOOD: Lipase: 16 U/L (ref 11–59)

## 2014-03-21 LAB — URINE MICROSCOPIC-ADD ON

## 2014-03-21 LAB — TROPONIN I: Troponin I: <0.3 ng/mL (ref <0.30)

## 2014-03-21 LAB — PREGNANCY, URINE: PREG TEST UR: NEGATIVE

## 2014-03-21 NOTE — Discharge Instructions (Signed)
Ibuprofen 600 mg every 6 hours as needed if pain recurs.  Return to the emergency department if you develop any worsening or new symptoms.  Chest Pain (Nonspecific) It is often hard to give a specific diagnosis for the cause of chest pain. There is always a chance that your pain could be related to something serious, such as a heart attack or a blood clot in the lungs. You need to follow up with your caregiver for further evaluation. CAUSES   Heartburn.  Pneumonia or bronchitis.  Anxiety or stress.  Inflammation around your heart (pericarditis) or lung (pleuritis or pleurisy).  A blood clot in the lung.  A collapsed lung (pneumothorax). It can develop suddenly on its own (spontaneous pneumothorax) or from injury (trauma) to the chest.  Shingles infection (herpes zoster virus). The chest wall is composed of bones, muscles, and cartilage. Any of these can be the source of the pain.  The bones can be bruised by injury.  The muscles or cartilage can be strained by coughing or overwork.  The cartilage can be affected by inflammation and become sore (costochondritis). DIAGNOSIS  Lab tests or other studies, such as X-rays, electrocardiography, stress testing, or cardiac imaging, may be needed to find the cause of your pain.  TREATMENT   Treatment depends on what may be causing your chest pain. Treatment may include:  Acid blockers for heartburn.  Anti-inflammatory medicine.  Pain medicine for inflammatory conditions.  Antibiotics if an infection is present.  You may be advised to change lifestyle habits. This includes stopping smoking and avoiding alcohol, caffeine, and chocolate.  You may be advised to keep your head raised (elevated) when sleeping. This reduces the chance of acid going backward from your stomach into your esophagus.  Most of the time, nonspecific chest pain will improve within 2 to 3 days with rest and mild pain medicine. HOME CARE INSTRUCTIONS   If  antibiotics were prescribed, take your antibiotics as directed. Finish them even if you start to feel better.  For the next few days, avoid physical activities that bring on chest pain. Continue physical activities as directed.  Do not smoke.  Avoid drinking alcohol.  Only take over-the-counter or prescription medicine for pain, discomfort, or fever as directed by your caregiver.  Follow your caregiver's suggestions for further testing if your chest pain does not go away.  Keep any follow-up appointments you made. If you do not go to an appointment, you could develop lasting (chronic) problems with pain. If there is any problem keeping an appointment, you must call to reschedule. SEEK MEDICAL CARE IF:   You think you are having problems from the medicine you are taking. Read your medicine instructions carefully.  Your chest pain does not go away, even after treatment.  You develop a rash with blisters on your chest. SEEK IMMEDIATE MEDICAL CARE IF:   You have increased chest pain or pain that spreads to your arm, neck, jaw, back, or abdomen.  You develop shortness of breath, an increasing cough, or you are coughing up blood.  You have severe back or abdominal pain, feel nauseous, or vomit.  You develop severe weakness, fainting, or chills.  You have a fever. THIS IS AN EMERGENCY. Do not wait to see if the pain will go away. Get medical help at once. Call your local emergency services (911 in [place] Do not drive yourself to the hospital. MAKE SURE YOU:   Understand these instructions.  Will watch your condition.  Will get  help right away if you are not doing well or get worse. Document Released: 08/05/2005 Document Revised: 01/18/2012 Document Reviewed: 05/31/2008 [facility] Patient Information 2014 [place].

## 2014-03-21 NOTE — ED Notes (Signed)
Patient states that this morning she had a sudden onset of severe pain in her mid chest. She says she "became hoarse" and felt some nausea. Episode lasted 5-10 minutes.

## 2014-03-21 NOTE — ED Notes (Signed)
Patient is resting comfortably. 

## 2014-03-21 NOTE — ED Provider Notes (Signed)
CSN: [id number]     Arrival date & time 03/21/14  0941 History   First MD Initiated Contact with Patient 03/21/14 1009     Chief Complaint  Patient presents with  . Chest Pain     (Consider location/radiation/quality/duration/timing/severity/associated sxs/prior Treatment) HPI Comments: Patient is a 33 year old female with history of fibromyalgia, asthma. She presents today with complaints of severe chest pain that started this morning shortly after waking from sleep. The pain was sharp in nature and located in the front of her chest. This lasted for approximately 5 minutes, then resolve spontaneously. She states during this episode she had urinary incontinence. She denies any fever or burning with urination. She denies any shortness of breath, nausea, diaphoresis, or radiation to the arm or jaw. She denies any recent exertional symptoms.  Patient is a 33 y.o. female presenting with chest pain. The history is provided by the patient.  Chest Pain Pain location:  Substernal area Pain quality: sharp   Pain radiates to:  Does not radiate Pain radiates to the back: no   Pain severity:  Severe Onset quality:  Sudden Duration:  5 minutes Timing:  Constant Progression:  Resolved Chronicity:  New Context: not breathing   Relieved by:  Nothing Worsened by:  Nothing tried Ineffective treatments:  None tried   Past Medical History  Diagnosis Date  . Asthma   . Fibromyalgia muscle pain   . Fibromyalgia   . Mental disorder   . Headache(784.0)     migraines  . Interstitial cystitis   . GERD (gastroesophageal reflux disease)   . Depression   . Abnormal Pap smear   . Normal pregnancy 05/16/2012  . SVD (spontaneous vaginal delivery) 05/17/2012  . PCOD (polycystic ovarian disease)   . Hypertension    Past Surgical History  Procedure Laterality Date  . Breast reduction surgery    . Appendectomy    . Cholecystectomy     Family History  Problem Relation Age of Onset  . Hypertension  Mother   . Asthma Mother   . Depression Mother   . Hypertension Maternal Grandmother   . Asthma Maternal Grandmother   . Heart attack Maternal Grandfather   . Heart disease Maternal Grandfather    History  Substance Use Topics  . Smoking status: Former Smoker    Quit date: 11/09/2008  . Smokeless tobacco: Never Used  . Alcohol Use: No   OB History   Grav Para Term Preterm Abortions TAB SAB Ect Mult Living   2 2 2  0 0 0 0 0 0 2     Review of Systems  Cardiovascular: Positive for chest pain.  All other systems reviewed and are negative.     Allergies  Sulfa antibiotics  Home Medications   Prior to Admission medications   Medication Sig Start Date End Date Taking? Authorizing Provider  albuterol (PROVENTIL HFA;VENTOLIN HFA) 108 (90 BASE) MCG/ACT inhaler Inhale 2 puffs into the lungs every 6 (six) hours as needed. For shortness of breath     Historical Provider, MD  albuterol (PROVENTIL HFA;VENTOLIN HFA) 108 (90 BASE) MCG/ACT inhaler Inhale 1-2 puffs into the lungs every 6 (six) hours as needed for wheezing. 12/26/12   April K Palumbo-Rasch, MD  albuterol (PROVENTIL HFA;VENTOLIN HFA) 108 (90 BASE) MCG/ACT inhaler Inhale 1-2 puffs into the lungs every 6 (six) hours as needed for wheezing. 08/11/13   Elson AreasLeslie K Sofia, PA-C  Cetirizine HCl (ZYRTEC PO) Take by mouth.    Historical Provider, MD  ciprofloxacin (CIPRO)  500 MG tablet Take 1 tablet (500 mg total) by mouth every 12 (twelve) hours. 09/04/13   Cathlyn ParsonsAngela M Kabbe, NP  clindamycin (CLEOCIN) 150 MG capsule Take 2 capsules (300 mg total) by mouth 3 (three) times daily. 01/21/13   Ethelda ChickMartha K Linker, MD  fluticasone (FLONASE) 50 MCG/ACT nasal spray Place 2 sprays into the nose daily. 12/26/12   April K Palumbo-Rasch, MD  METFORMIN HCL PO Take 500 mg by mouth 2 (two) times daily.     Historical Provider, MD  norgestimate-ethinyl estradiol (ORTHO-CYCLEN,SPRINTEC,PREVIFEM) 0.25-35 MG-MCG tablet Take 1 tablet by mouth daily.    Historical  Provider, MD  oxyCODONE-acetaminophen (PERCOCET/ROXICET) 5-325 MG per tablet Take 1-2 tablets by mouth every 6 (six) hours as needed for pain. 01/21/13   Ethelda ChickMartha K Linker, MD  predniSONE (DELTASONE) 10 MG tablet 6,5,4,3,2,1 taper 08/11/13   Elson AreasLeslie K Sofia, PA-C  pseudoephedrine (SUDAFED) 30 MG tablet Take 30 mg by mouth at bedtime as needed. For congestion    Historical Provider, MD  Vitamin D, Ergocalciferol, (DRISDOL) 50000 UNITS CAPS capsule Take 50,000 Units by mouth.    Historical Provider, MD   BP 147/88  Pulse 98  Temp(Src) 98.3 F (36.8 C) (Oral)  Resp 18  SpO2 99% Physical Exam  Nursing note and vitals reviewed. Constitutional: She is oriented to person, place, and time. She appears well-developed and well-nourished. No distress.  HENT:  Head: Normocephalic and atraumatic.  Neck: Normal range of motion. Neck supple.  Cardiovascular: Normal rate and regular rhythm.  Exam reveals no gallop and no friction rub.   No murmur heard. Pulmonary/Chest: Effort normal and breath sounds normal. No respiratory distress. She has no wheezes.  Abdominal: Soft. Bowel sounds are normal. She exhibits no distension. There is no tenderness.  Musculoskeletal: Normal range of motion.  Neurological: She is alert and oriented to person, place, and time.  Skin: Skin is warm and dry. She is not diaphoretic.    ED Course  Procedures (including critical care time) Labs Review Labs Reviewed  CBC WITH DIFFERENTIAL  COMPREHENSIVE METABOLIC PANEL  TROPONIN I  LIPASE, BLOOD  URINALYSIS, ROUTINE W REFLEX MICROSCOPIC  PREGNANCY, URINE    Imaging Review No results found.   EKG Interpretation   Date/Time:  Wednesday Mar 21 2014 10:21:41 EDT Ventricular Rate:  87 PR Interval:  144 QRS Duration: 88 QT Interval:  380 QTC Calculation: 457 R Axis:   13 Text Interpretation:  Normal sinus rhythm Nonspecific T wave abnormality  Abnormal ECG No significant change since 08/11/13 Confirmed by DELOS  MD,   Sindhu Nguyen ([id number]) on 03/21/2014 12:12:17 PM      MDM   Final diagnoses:  None    Patient is a 33 year old female with history of fibromyalgia who presents after a five-minute episode of sharp chest pain that started just after waking from sleep. This resolved spontaneously. She is currently pain free. Workup reveals a normal EKG and negative laboratory studies. I doubt a cardiac etiology and tend to favor a musculoskeletal etiology. There is nothing else to suggest gallstone or other emergent pathology. Week she is appropriate for discharge to followup as needed if symptoms worsen or change.    Geoffery Lyonsouglas Lennard Capek, MD 03/21/14 [phone]

## 2014-05-06 ENCOUNTER — Inpatient Hospital Stay (HOSPITAL_COMMUNITY)
Admission: AD | Admit: 2014-05-06 | Discharge: 2014-05-06 | Disposition: A | Discharge status: Home / Self Care | Payer: BC Managed Care – PPO | Source: Ambulatory Visit | Attending: Obstetrics and Gynecology | Admitting: Obstetrics and Gynecology

## 2014-05-06 ENCOUNTER — Encounter (HOSPITAL_COMMUNITY): Payer: Self-pay

## 2014-05-06 ENCOUNTER — Inpatient Hospital Stay (HOSPITAL_COMMUNITY): Payer: BC Managed Care – PPO

## 2014-05-06 DIAGNOSIS — K219 Gastro-esophageal reflux disease without esophagitis: Secondary | ICD-10-CM | POA: Insufficient documentation

## 2014-05-06 DIAGNOSIS — R109 Unspecified abdominal pain: Secondary | ICD-10-CM | POA: Insufficient documentation

## 2014-05-06 DIAGNOSIS — Z87891 Personal history of nicotine dependence: Secondary | ICD-10-CM | POA: Insufficient documentation

## 2014-05-06 DIAGNOSIS — O10019 Pre-existing essential hypertension complicating pregnancy, unspecified trimester: Secondary | ICD-10-CM | POA: Insufficient documentation

## 2014-05-06 DIAGNOSIS — O26859 Spotting complicating pregnancy, unspecified trimester: Secondary | ICD-10-CM | POA: Insufficient documentation

## 2014-05-06 DIAGNOSIS — O209 Hemorrhage in early pregnancy, unspecified: Secondary | ICD-10-CM

## 2014-05-06 HISTORY — DX: Anxiety disorder, unspecified: F41.9

## 2014-05-06 LAB — CBC
HCT: 35.1 % — ABNORMAL LOW (ref 36.0–46.0)
Hemoglobin: 11.8 g/dL — ABNORMAL LOW (ref 12.0–15.0)
MCH: 29.4 pg (ref 26.0–34.0)
MCHC: 33.6 g/dL (ref 30.0–36.0)
MCV: 87.5 fL (ref 78.0–100.0)
Platelets: 285 10*3/uL (ref 150–400)
RBC: 4.01 MIL/uL (ref 3.87–5.11)
RDW: 14.4 % (ref 11.5–15.5)
WBC: 10.7 10*3/uL — ABNORMAL HIGH (ref 4.0–10.5)

## 2014-05-06 LAB — URINALYSIS, ROUTINE W REFLEX MICROSCOPIC
BILIRUBIN URINE: NEGATIVE
Glucose, UA: NEGATIVE mg/dL
HGB URINE DIPSTICK: NEGATIVE
KETONES UR: 15 mg/dL — AB
Leukocytes, UA: NEGATIVE
NITRITE: NEGATIVE
PROTEIN: NEGATIVE mg/dL
Specific Gravity, Urine: 1.025 (ref 1.005–1.030)
UROBILINOGEN UA: 1 mg/dL (ref 0.0–1.0)
pH: 6.5 (ref 5.0–8.0)

## 2014-05-06 LAB — WET PREP, GENITAL
Clue Cells Wet Prep HPF POC: NONE SEEN
Trich, Wet Prep: NONE SEEN
Yeast Wet Prep HPF POC: NONE SEEN

## 2014-05-06 LAB — HCG, QUANTITATIVE, PREGNANCY: hCG, Beta Chain, Quant, S: 24873 m[IU]/mL — ABNORMAL HIGH (ref <5)

## 2014-05-06 LAB — POCT PREGNANCY, URINE: Preg Test, Ur: POSITIVE — AB

## 2014-05-06 NOTE — Discharge Instructions (Signed)
Threatened Miscarriage °A threatened miscarriage occurs when you have vaginal bleeding during your first 20 weeks of pregnancy but the pregnancy has not ended. If you have vaginal bleeding during this time, your health care provider will do tests to make sure you are still pregnant. If the tests show you are still pregnant and the developing baby (fetus) inside your womb (uterus) is still growing, your condition is considered a threatened miscarriage. °A threatened miscarriage does not mean your pregnancy will end, but it does increase the risk of losing your pregnancy (complete miscarriage). °CAUSES  °The cause of a threatened miscarriage is usually not known. If you go on to have a complete miscarriage, the most common cause is an abnormal number of chromosomes in the developing baby. Chromosomes are the structures inside cells that hold all your genetic material. °Some causes of vaginal bleeding that do not result in miscarriage include: °· Having sex. °· Having an infection. °· Normal hormone changes of pregnancy. °· Bleeding that occurs when an egg implants in your uterus. °RISK FACTORS °Risk factors for bleeding in early pregnancy include: °· Obesity. °· Smoking. °· Drinking excessive amounts of alcohol or caffeine. °· Recreational drug use. °SIGNS AND SYMPTOMS °· Light vaginal bleeding. °· Mild abdominal pain or cramps. °DIAGNOSIS  °If you have bleeding with or without abdominal pain before 20 weeks of pregnancy, your health care provider will do tests to check whether you are still pregnant. One important test involves using sound waves and a computer (ultrasound) to create images of the inside of your uterus. Other tests include an internal exam of your vagina and uterus (pelvic exam) and measurement of your baby's heart rate.  °You may be diagnosed with a threatened miscarriage if: °· Ultrasound testing shows you are still pregnant. °· Your baby's heart rate is strong. °· A pelvic exam shows that the  opening between your uterus and your vagina (cervix) is closed. °· Your heart rate and blood pressure are stable. °· Blood tests confirm you are still pregnant. °TREATMENT  °No treatments have been shown to prevent a threatened miscarriage from going on to a complete miscarriage. However, the right home care is important.  °HOME CARE INSTRUCTIONS  °· Make sure you keep all your appointments for prenatal care. This is very important. °· Get plenty of rest. °· Do not have sex or use tampons if you have vaginal bleeding. °· Do not douche. °· Do not smoke or use recreational drugs. °· Do not drink alcohol. °· Avoid caffeine. °SEEK MEDICAL CARE IF: °· You have light vaginal bleeding or spotting while pregnant. °· You have abdominal pain or cramping. °· You have a fever. °SEEK IMMEDIATE MEDICAL CARE IF: °· You have heavy vaginal bleeding. °· You have blood clots coming from your vagina. °· You have severe low back pain or abdominal cramps. °· You have fever, chills, and severe abdominal pain. °MAKE SURE YOU: °· Understand these instructions. °· Will watch your condition. °· Will get help right away if you are not doing well or get worse. °Document Released: 10/26/2005 Document Revised: 10/31/2013 Document Reviewed: 08/22/2013 °[employer]® Patient Information ©2015 [employer]. This information is not intended to replace advice given to you by your health care provider. Make sure you discuss any questions you have with your health care provider. ° °Pelvic Rest °Pelvic rest is sometimes recommended for women when:  °· The placenta is partially or completely covering the opening of the cervix (placenta previa). °· There is bleeding between the   uterine wall and the amniotic sac in the first trimester (subchorionic hemorrhage). °· The cervix begins to open without labor starting (incompetent cervix, cervical insufficiency). °· The labor is too early (preterm labor). °HOME CARE INSTRUCTIONS °· Do not have sexual intercourse,  stimulation, or an orgasm. °· Do not use tampons, douche, or put anything in the vagina. °· Do not lift anything over 10 pounds (4.5 kg). °· Avoid strenuous activity or straining your pelvic muscles. °SEEK MEDICAL CARE IF:  °· You have any vaginal bleeding during pregnancy. Treat this as a potential emergency. °· You have cramping pain felt low in the stomach (stronger than menstrual cramps). °· You notice vaginal discharge (watery, mucus, or bloody). °· You have a low, dull backache. °· There are regular contractions or uterine tightening. °SEEK IMMEDIATE MEDICAL CARE IF: °You have vaginal bleeding and have placenta previa.  °Document Released: 02/20/2011 Document Revised: 01/18/2012 Document Reviewed: 02/20/2011 °[employer]® Patient Information ©2015 [employer]. This information is not intended to replace advice given to you by your health care provider. Make sure you discuss any questions you have with your health care provider. ° °

## 2014-05-06 NOTE — MAU Provider Note (Signed)
History     CSN: [id number]  Arrival date and time: 05/06/14 1247   First Provider Initiated Contact with Patient 05/06/14 1335      Chief Complaint  Patient presents with  . Vaginal Bleeding   HPI [name] 33 y.o. [id number] at 1622w2d presents with c/o cramping and spotting. She had emotional stressful evening last night, had 2 hr drive home.She has low abd and low back cramping the entire time. This am started brown spotting. No change in discharge, odor or itching, no UTI S&S, has nausea.     Past Medical History  Diagnosis Date  . Asthma   . Fibromyalgia muscle pain   . Fibromyalgia   . Mental disorder   . Headache(784.0)     migraines  . Interstitial cystitis   . GERD (gastroesophageal reflux disease)   . Depression   . Abnormal Pap smear   . Normal pregnancy 05/16/2012  . SVD (spontaneous vaginal delivery) 05/17/2012  . PCOD (polycystic ovarian disease)   . Hypertension   . Anxiety     Past Surgical History  Procedure Laterality Date  . Breast reduction surgery    . Appendectomy    . Cholecystectomy      Family History  Problem Relation Age of Onset  . Hypertension Mother   . Asthma Mother   . Depression Mother   . Hypertension Maternal Grandmother   . Asthma Maternal Grandmother   . Heart attack Maternal Grandfather   . Heart disease Maternal Grandfather     History  Substance Use Topics  . Smoking status: Former Smoker    Quit date: 11/09/2008  . Smokeless tobacco: Never Used  . Alcohol Use: No    Allergies:  Allergies  Allergen Reactions  . Sulfa Antibiotics Swelling    Prescriptions prior to admission  Medication Sig Dispense Refill  . albuterol (PROVENTIL HFA;VENTOLIN HFA) 108 (90 BASE) MCG/ACT inhaler Inhale 2 puffs into the lungs every 6 (six) hours as needed. For shortness of breath       . albuterol (PROVENTIL HFA;VENTOLIN HFA) 108 (90 BASE) MCG/ACT inhaler Inhale 1-2 puffs into the lungs every 6 (six) hours as needed for wheezing.  1  Inhaler  0  . albuterol (PROVENTIL HFA;VENTOLIN HFA) 108 (90 BASE) MCG/ACT inhaler Inhale 1-2 puffs into the lungs every 6 (six) hours as needed for wheezing.  1 Inhaler  0  . Cetirizine HCl (ZYRTEC PO) Take by mouth.      . ciprofloxacin (CIPRO) 500 MG tablet Take 1 tablet (500 mg total) by mouth every 12 (twelve) hours.  20 tablet  0  . clindamycin (CLEOCIN) 150 MG capsule Take 2 capsules (300 mg total) by mouth 3 (three) times daily.  21 capsule  0  . fluticasone (FLONASE) 50 MCG/ACT nasal spray Place 2 sprays into the nose daily.  16 g  0  . METFORMIN HCL PO Take 500 mg by mouth 2 (two) times daily.       . norgestimate-ethinyl estradiol (ORTHO-CYCLEN,SPRINTEC,PREVIFEM) 0.25-35 MG-MCG tablet Take 1 tablet by mouth daily.      Marland Kitchen. oxyCODONE-acetaminophen (PERCOCET/ROXICET) 5-325 MG per tablet Take 1-2 tablets by mouth every 6 (six) hours as needed for pain.  15 tablet  0  . predniSONE (DELTASONE) 10 MG tablet 6,5,4,3,2,1 taper  21 tablet  0  . pseudoephedrine (SUDAFED) 30 MG tablet Take 30 mg by mouth at bedtime as needed. For congestion      . Vitamin D, Ergocalciferol, (DRISDOL) 50000 UNITS CAPS capsule Take  50,000 Units by mouth.        Review of Systems  Constitutional: Negative for fever and chills.  Gastrointestinal: Positive for nausea and abdominal pain.  Genitourinary: Negative for dysuria, urgency and frequency.       Spotting   Physical Exam   Blood pressure 139/70, pulse 90, temperature 97.9 F (36.6 C), temperature source Oral, resp. rate 18, last menstrual period 03/09/2014, SpO2 99.00%, not currently breastfeeding.  Physical Exam  Nursing note and vitals reviewed. Constitutional: She is oriented to person, place, and time. She appears well-developed and well-nourished.  GI: Soft. There is no tenderness.  Genitourinary:  Pelvic exam: Ext gen- nl anatomy,skin intact Vagina- small amt creamy thick discharge Cx- closed U-t enlarged Adn- non tender, no masses palp   Musculoskeletal: Normal range of motion.  Neurological: She is alert and oriented to person, place, and time.  Skin: Skin is warm and dry.  Psychiatric: She has a normal mood and affect. Her behavior is normal.    MAU Course  Procedures Results for orders placed during the hospital encounter of 05/06/14 (from the past 24 hour(s))  URINALYSIS, ROUTINE W REFLEX MICROSCOPIC     Status: Abnormal   Collection Time    05/06/14  1:04 PM      Result Value Ref Range   Color, Urine YELLOW  YELLOW   APPearance CLEAR  CLEAR   Specific Gravity, Urine 1.025  1.005 - 1.030   pH 6.5  5.0 - 8.0   Glucose, UA NEGATIVE  NEGATIVE mg/dL   Hgb urine dipstick NEGATIVE  NEGATIVE   Bilirubin Urine NEGATIVE  NEGATIVE   Ketones, ur 15 (*) NEGATIVE mg/dL   Protein, ur NEGATIVE  NEGATIVE mg/dL   Urobilinogen, UA 1.0  0.0 - 1.0 mg/dL   Nitrite NEGATIVE  NEGATIVE   Leukocytes, UA NEGATIVE  NEGATIVE  POCT PREGNANCY, URINE     Status: Abnormal   Collection Time    05/06/14  1:12 PM      Result Value Ref Range   Preg Test, Ur POSITIVE (*) NEGATIVE  HCG, QUANTITATIVE, PREGNANCY     Status: Abnormal   Collection Time    05/06/14  1:38 PM      Result Value Ref Range   hCG, Beta ChainSharene Butters, [place] [address] (*) <5 mIU/mL  CBC     Status: Abnormal   Collection Time    05/06/14  1:38 PM      Result Value Ref Range   WBC 10.7 (*) 4.0 - 10.5 K/uL   RBC 4.01  3.87 - 5.11 MIL/uL   Hemoglobin 11.8 (*) 12.0 - 15.0 g/dL   HCT 60.4 (*) 54.0 - 98.1 %   MCV 87.5  78.0 - 100.0 fL   MCH 29.4  26.0 - 34.0 pg   MCHC 33.6  30.0 - 36.0 g/dL   RDW 19.1  47.8 - 29.5 %   Platelets 285  150 - 400 K/uL  WET PREP, GENITAL     Status: Abnormal   Collection Time    05/06/14  2:20 PM      Result Value Ref Range   Yeast Wet Prep HPF POC NONE SEEN  NONE SEEN   Trich, Wet Prep NONE SEEN  NONE SEEN   Clue Cells Wet Prep HPF POC NONE SEEN  NONE SEEN   WBC, Wet Prep HPF POC TOO NUMEROUS TO COUNT (*) NONE SEEN  U/S- 6 4/7 wk IUGS with  yolk sac, no embryo or cardiac activity. No  adnexal masses.   Assessment and Plan  A. 6 4/7 wk IUGS, cannot determine viability at this date   P. Repeat U/S 1 wk Consulted Dr Ambrose MantleHenley- pt to call the office for U/S in 1 wk  Aleana Fifita M. 05/06/2014, 1:37 PM

## 2014-05-06 NOTE — MAU Note (Signed)
Pt states hx irregular cycles, LMP-03/09/2014, however negative upt 03/21/2014 per [facility]. Had +upt at home and went to MD office to confirm pregnancy. Having brown spotting, not red. Was in stressful situation yesterday and had two hour drive home, and states she cramped the whole time.

## 2014-05-07 LAB — GC/CHLAMYDIA PROBE AMP
CT Probe RNA: NEGATIVE
GC Probe RNA: NEGATIVE

## 2014-06-12 ENCOUNTER — Encounter (HOSPITAL_COMMUNITY): Payer: Self-pay | Admitting: *Deleted

## 2014-06-19 ENCOUNTER — Ambulatory Visit (HOSPITAL_COMMUNITY)
Admission: RE | Admit: 2014-06-19 | Payer: BC Managed Care – PPO | Source: Ambulatory Visit | Admitting: Obstetrics and Gynecology

## 2014-06-19 SURGERY — DILATION AND EVACUATION, UTERUS
Anesthesia: Choice

## 2014-09-10 ENCOUNTER — Encounter (HOSPITAL_COMMUNITY): Payer: Self-pay | Admitting: *Deleted

## 2014-10-19 ENCOUNTER — Emergency Department (HOSPITAL_COMMUNITY)
Admission: EM | Admit: 2014-10-19 | Discharge: 2014-10-19 | Disposition: A | Discharge status: Home / Self Care | Payer: BC Managed Care – PPO | Attending: Emergency Medicine | Admitting: Emergency Medicine

## 2014-10-19 ENCOUNTER — Encounter (HOSPITAL_COMMUNITY): Payer: Self-pay | Admitting: Emergency Medicine

## 2014-10-19 DIAGNOSIS — Z87891 Personal history of nicotine dependence: Secondary | ICD-10-CM | POA: Diagnosis not present

## 2014-10-19 DIAGNOSIS — Z8659 Personal history of other mental and behavioral disorders: Secondary | ICD-10-CM | POA: Diagnosis not present

## 2014-10-19 DIAGNOSIS — Z79899 Other long term (current) drug therapy: Secondary | ICD-10-CM | POA: Diagnosis not present

## 2014-10-19 DIAGNOSIS — Z3202 Encounter for pregnancy test, result negative: Secondary | ICD-10-CM | POA: Diagnosis not present

## 2014-10-19 DIAGNOSIS — Z8719 Personal history of other diseases of the digestive system: Secondary | ICD-10-CM | POA: Insufficient documentation

## 2014-10-19 DIAGNOSIS — Z8739 Personal history of other diseases of the musculoskeletal system and connective tissue: Secondary | ICD-10-CM | POA: Insufficient documentation

## 2014-10-19 DIAGNOSIS — J45909 Unspecified asthma, uncomplicated: Secondary | ICD-10-CM | POA: Diagnosis not present

## 2014-10-19 DIAGNOSIS — Z8639 Personal history of other endocrine, nutritional and metabolic disease: Secondary | ICD-10-CM | POA: Diagnosis not present

## 2014-10-19 DIAGNOSIS — R112 Nausea with vomiting, unspecified: Secondary | ICD-10-CM | POA: Insufficient documentation

## 2014-10-19 DIAGNOSIS — Z87448 Personal history of other diseases of urinary system: Secondary | ICD-10-CM | POA: Diagnosis not present

## 2014-10-19 DIAGNOSIS — I10 Essential (primary) hypertension: Secondary | ICD-10-CM | POA: Insufficient documentation

## 2014-10-19 LAB — COMPREHENSIVE METABOLIC PANEL
ALT: 21 U/L (ref 0–35)
AST: 16 U/L (ref 0–37)
Albumin: 3.6 g/dL (ref 3.5–5.2)
Alkaline Phosphatase: 68 U/L (ref 39–117)
Anion gap: 14 (ref 5–15)
BILIRUBIN TOTAL: 0.4 mg/dL (ref 0.3–1.2)
BUN: 9 mg/dL (ref 6–23)
CALCIUM: 9.3 mg/dL (ref 8.4–10.5)
CO2: 23 mEq/L (ref 19–32)
Chloride: 103 mEq/L (ref 96–112)
Creatinine, Ser: 0.65 mg/dL (ref 0.50–1.10)
GFR calc non Af Amer: >90 mL/min (ref >90)
Glucose, Bld: 139 mg/dL — ABNORMAL HIGH (ref 70–99)
Potassium: 4.3 mEq/L (ref 3.7–5.3)
Sodium: 140 mEq/L (ref 137–147)
Total Protein: 7.4 g/dL (ref 6.0–8.3)

## 2014-10-19 LAB — CBC WITH DIFFERENTIAL/PLATELET
Basophils Absolute: 0 10*3/uL (ref 0.0–0.1)
Basophils Relative: 0 % (ref 0–1)
EOS PCT: 1 % (ref 0–5)
Eosinophils Absolute: 0.1 10*3/uL (ref 0.0–0.7)
HEMATOCRIT: 37.8 % (ref 36.0–46.0)
HEMOGLOBIN: 12.8 g/dL (ref 12.0–15.0)
LYMPHS ABS: 2.4 10*3/uL (ref 0.7–4.0)
Lymphocytes Relative: 21 % (ref 12–46)
MCH: 28.1 pg (ref 26.0–34.0)
MCHC: 33.9 g/dL (ref 30.0–36.0)
MCV: 83.1 fL (ref 78.0–100.0)
MONOS PCT: 4 % (ref 3–12)
Monocytes Absolute: 0.5 10*3/uL (ref 0.1–1.0)
Neutro Abs: 8.2 10*3/uL — ABNORMAL HIGH (ref 1.7–7.7)
Neutrophils Relative %: 74 % (ref 43–77)
Platelets: 317 10*3/uL (ref 150–400)
RBC: 4.55 MIL/uL (ref 3.87–5.11)
RDW: 14.5 % (ref 11.5–15.5)
WBC: 11.1 10*3/uL — ABNORMAL HIGH (ref 4.0–10.5)

## 2014-10-19 LAB — URINALYSIS, ROUTINE W REFLEX MICROSCOPIC
GLUCOSE, UA: NEGATIVE mg/dL
Hgb urine dipstick: NEGATIVE
KETONES UR: 15 mg/dL — AB
LEUKOCYTES UA: NEGATIVE
Nitrite: NEGATIVE
Protein, ur: 30 mg/dL — AB
Specific Gravity, Urine: 1.044 — ABNORMAL HIGH (ref 1.005–1.030)
Urobilinogen, UA: 0.2 mg/dL (ref 0.0–1.0)
pH: 5.5 (ref 5.0–8.0)

## 2014-10-19 LAB — URINE MICROSCOPIC-ADD ON

## 2014-10-19 LAB — LIPASE, BLOOD: Lipase: 16 U/L (ref 11–59)

## 2014-10-19 LAB — POC URINE PREG, ED: Preg Test, Ur: NEGATIVE

## 2014-10-19 MED ORDER — SODIUM CHLORIDE 0.9 % IV BOLUS (SEPSIS)
1000.0000 mL | Freq: Once | INTRAVENOUS | Status: AC
  Administered 2014-10-19: 1000 mL via INTRAVENOUS

## 2014-10-19 MED ORDER — ONDANSETRON HCL 4 MG PO TABS: 4.0000 mg | ORAL_TABLET | Freq: Four times a day (QID) | ORAL | Status: DC

## 2014-10-19 MED ORDER — ONDANSETRON HCL 4 MG/2ML IJ SOLN
INTRAMUSCULAR | Status: AC
  Filled 2014-10-19: qty 2

## 2014-10-19 MED ORDER — ONDANSETRON HCL 4 MG/2ML IJ SOLN
4.0000 mg | Freq: Once | INTRAMUSCULAR | Status: AC
  Administered 2014-10-19: 4 mg via INTRAVENOUS
  Filled 2014-10-19: qty 2

## 2014-10-19 MED ORDER — SODIUM CHLORIDE 0.9 % IV BOLUS (SEPSIS): 1000.0000 mL | Freq: Once | INTRAVENOUS | Status: DC

## 2014-10-19 NOTE — ED Notes (Signed)
Pt c/o N/V starting today with not feeling well; pt sts BP was elevated with hx of same

## 2014-10-19 NOTE — Discharge Instructions (Signed)

## 2014-10-19 NOTE — ED Provider Notes (Signed)
CSN: [id number]     Arrival date & time 10/19/14  1633 History   First MD Initiated Contact with Patient 10/19/14 1752     Chief Complaint  Patient presents with  . Vomiting     (Consider location/radiation/quality/duration/timing/severity/associated sxs/prior Treatment) Patient is a 33 y.o. female presenting with vomiting. The history is provided by the patient.  Emesis Severity:  Moderate Duration:  12 hours Timing:  Constant Number of daily episodes:  6 Quality:  Undigested food Able to tolerate:  Liquids Progression:  Unchanged Chronicity:  New Recent urination:  Normal Relieved by:  None tried Worsened by:  Nothing tried Ineffective treatments:  None tried Associated symptoms: no abdominal pain, no arthralgias, no chills, no cough, no diarrhea, no fever, no headaches, no sore throat and no URI   Risk factors: no diabetes, no sick contacts and no suspect food intake     Past Medical History  Diagnosis Date  . Asthma   . Fibromyalgia muscle pain   . Fibromyalgia   . Mental disorder   . Headache(784.0)     migraines  . Interstitial cystitis   . GERD (gastroesophageal reflux disease)   . Depression   . Abnormal Pap smear   . Normal pregnancy 05/16/2012  . SVD (spontaneous vaginal delivery) 05/17/2012  . PCOD (polycystic ovarian disease)   . Anxiety   . Hypertension     no meds currently   Past Surgical History  Procedure Laterality Date  . Breast reduction surgery    . Appendectomy    . Cholecystectomy     Family History  Problem Relation Age of Onset  . Hypertension Mother   . Asthma Mother   . Depression Mother   . Hypertension Maternal Grandmother   . Asthma Maternal Grandmother   . Heart attack Maternal Grandfather   . Heart disease Maternal Grandfather    History  Substance Use Topics  . Smoking status: Former Smoker    Quit date: 11/09/2008  . Smokeless tobacco: Never Used  . Alcohol Use: No   OB History    Gravida Para Term Preterm AB TAB  SAB Ectopic Multiple Living   3 2 2  0 0 0 0 0 0 2     Review of Systems  Constitutional: Negative for chills.  HENT: Negative for sore throat.   Gastrointestinal: Positive for vomiting. Negative for abdominal pain and diarrhea.  Musculoskeletal: Negative for arthralgias.  Neurological: Negative for headaches.  All other systems reviewed and are negative.     Allergies  Sulfa antibiotics  Home Medications   Prior to Admission medications   Medication Sig Start Date End Date Taking? Authorizing Provider  albuterol (PROVENTIL HFA;VENTOLIN HFA) 108 (90 BASE) MCG/ACT inhaler Inhale 2 puffs into the lungs every 6 (six) hours as needed. For shortness of breath     Historical Provider, MD  cetirizine (ZYRTEC) 10 MG tablet Take 10 mg by mouth daily.    Historical Provider, MD  metFORMIN (GLUCOPHAGE) 500 MG tablet Take 500 mg by mouth 2 (two) times daily with a meal.    Historical Provider, MD  METFORMIN HCL PO Take 500 mg by mouth 2 (two) times daily.     Historical Provider, MD  ondansetron (ZOFRAN) 4 MG tablet Take 1 tablet (4 mg total) by mouth every 6 (six) hours. 10/19/14   Erskine Emeryhris Traven Davids, MD  Pantoprazole Sodium (PROTONIX PO) Take 1 tablet by mouth daily. Pharmacy was close    Historical Provider, MD  Prenatal Vit-Fe Fumarate-FA (PRENATAL MULTIVITAMIN)  TABS tablet Take 1 tablet by mouth daily at 12 noon.    Historical Provider, MD   BP 114/62 mmHg  Pulse 100  Temp(Src) 97.8 F (36.6 C) (Oral)  Resp 17  SpO2 100%  LMP 09/28/2014  Breastfeeding? Unknown Physical Exam  Constitutional: She is oriented to person, place, and time. She appears well-developed and well-nourished.  HENT:  Head: Normocephalic and atraumatic.  Eyes: EOM are normal. Pupils are equal, round, and reactive to light.  Neck: Normal range of motion. Neck supple. No thyromegaly present.  Cardiovascular: Normal rate, regular rhythm and normal heart sounds.   No murmur heard. Pulmonary/Chest: Effort normal and  breath sounds normal. No respiratory distress.  Abdominal: Soft. Bowel sounds are normal. She exhibits no distension. There is no tenderness. There is no rebound.  Musculoskeletal: Normal range of motion. She exhibits no edema.  Neurological: She is alert and oriented to person, place, and time.  Skin: Skin is warm and dry.  Psychiatric: She has a normal mood and affect.  Nursing note and vitals reviewed.   ED Course  Procedures (including critical care time) Labs Review Labs Reviewed  CBC WITH DIFFERENTIAL - Abnormal; Notable for the following:    WBC 11.1 (*)    Neutro Abs 8.2 (*)    All other components within normal limits  COMPREHENSIVE METABOLIC PANEL - Abnormal; Notable for the following:    Glucose, Bld 139 (*)    All other components within normal limits  URINALYSIS, ROUTINE W REFLEX MICROSCOPIC - Abnormal; Notable for the following:    Color, Urine AMBER (*)    APPearance CLOUDY (*)    Specific Gravity, Urine 1.044 (*)    Bilirubin Urine SMALL (*)    Ketones, ur 15 (*)    Protein, ur 30 (*)    All other components within normal limits  URINE MICROSCOPIC-ADD ON - Abnormal; Notable for the following:    Squamous Epithelial / LPF FEW (*)    All other components within normal limits  LIPASE, BLOOD  POC URINE PREG, ED    Imaging Review No results found.   EKG Interpretation None      MDM   Final diagnoses:  Non-intractable vomiting with nausea, vomiting of unspecified type   33 year old female presents with nausea and vomiting. Symptoms began this morning, she has had approximately 5-6 episodes of nonbloody nonbilious emesis. She has no associated abdominal pain, normal abdominal exam without evidence of peritonitis. She says she has been able to tolerate liquids, but this has not had much of an appetite. She's not had any diarrhea. UA shows mild ketonuria, consistent with dehydration. She did have one near syncopal episode after vomiting, but no concerning  findings on history and physical.  She was hydrated, symptoms improved with Zofran. She is given return precautions and instructed to return should her symptoms not improve in the next several days.     Erskine Emeryhris Chesney Klimaszewski, MD 10/20/14 [id number]  Nelia Shiobert L Beaton, MD 10/20/14 [phone]

## 2015-04-30 ENCOUNTER — Emergency Department (HOSPITAL_COMMUNITY)
Admission: EM | Admit: 2015-04-30 | Discharge: 2015-04-30 | Disposition: A | Discharge status: Home / Self Care | Payer: BLUE CROSS/BLUE SHIELD | Attending: Emergency Medicine | Admitting: Emergency Medicine

## 2015-04-30 ENCOUNTER — Emergency Department (HOSPITAL_COMMUNITY): Payer: BLUE CROSS/BLUE SHIELD

## 2015-04-30 ENCOUNTER — Encounter (HOSPITAL_COMMUNITY): Payer: Self-pay | Admitting: Emergency Medicine

## 2015-04-30 DIAGNOSIS — J029 Acute pharyngitis, unspecified: Secondary | ICD-10-CM | POA: Diagnosis not present

## 2015-04-30 DIAGNOSIS — F419 Anxiety disorder, unspecified: Secondary | ICD-10-CM | POA: Insufficient documentation

## 2015-04-30 DIAGNOSIS — Z3202 Encounter for pregnancy test, result negative: Secondary | ICD-10-CM | POA: Insufficient documentation

## 2015-04-30 DIAGNOSIS — M797 Fibromyalgia: Secondary | ICD-10-CM | POA: Diagnosis not present

## 2015-04-30 DIAGNOSIS — R110 Nausea: Secondary | ICD-10-CM | POA: Diagnosis not present

## 2015-04-30 DIAGNOSIS — R000 Tachycardia, unspecified: Secondary | ICD-10-CM | POA: Diagnosis not present

## 2015-04-30 DIAGNOSIS — K219 Gastro-esophageal reflux disease without esophagitis: Secondary | ICD-10-CM | POA: Insufficient documentation

## 2015-04-30 DIAGNOSIS — R509 Fever, unspecified: Secondary | ICD-10-CM | POA: Insufficient documentation

## 2015-04-30 DIAGNOSIS — J45901 Unspecified asthma with (acute) exacerbation: Secondary | ICD-10-CM | POA: Insufficient documentation

## 2015-04-30 DIAGNOSIS — Z87891 Personal history of nicotine dependence: Secondary | ICD-10-CM | POA: Diagnosis not present

## 2015-04-30 DIAGNOSIS — F329 Major depressive disorder, single episode, unspecified: Secondary | ICD-10-CM | POA: Diagnosis not present

## 2015-04-30 DIAGNOSIS — Z87448 Personal history of other diseases of urinary system: Secondary | ICD-10-CM | POA: Diagnosis not present

## 2015-04-30 DIAGNOSIS — R202 Paresthesia of skin: Secondary | ICD-10-CM | POA: Insufficient documentation

## 2015-04-30 DIAGNOSIS — G43909 Migraine, unspecified, not intractable, without status migrainosus: Secondary | ICD-10-CM | POA: Diagnosis not present

## 2015-04-30 DIAGNOSIS — F99 Mental disorder, not otherwise specified: Secondary | ICD-10-CM | POA: Insufficient documentation

## 2015-04-30 DIAGNOSIS — Z79899 Other long term (current) drug therapy: Secondary | ICD-10-CM | POA: Insufficient documentation

## 2015-04-30 DIAGNOSIS — I10 Essential (primary) hypertension: Secondary | ICD-10-CM | POA: Diagnosis not present

## 2015-04-30 DIAGNOSIS — R61 Generalized hyperhidrosis: Secondary | ICD-10-CM | POA: Insufficient documentation

## 2015-04-30 DIAGNOSIS — R42 Dizziness and giddiness: Secondary | ICD-10-CM | POA: Diagnosis present

## 2015-04-30 LAB — CBC
HEMATOCRIT: 38 % (ref 36.0–46.0)
HEMOGLOBIN: 13.2 g/dL (ref 12.0–15.0)
MCH: 28.6 pg (ref 26.0–34.0)
MCHC: 34.7 g/dL (ref 30.0–36.0)
MCV: 82.4 fL (ref 78.0–100.0)
Platelets: 267 10*3/uL (ref 150–400)
RBC: 4.61 MIL/uL (ref 3.87–5.11)
RDW: 13.8 % (ref 11.5–15.5)
WBC: 8.8 10*3/uL (ref 4.0–10.5)

## 2015-04-30 LAB — URINALYSIS, ROUTINE W REFLEX MICROSCOPIC
Bilirubin Urine: NEGATIVE
Glucose, UA: NEGATIVE mg/dL
Hgb urine dipstick: NEGATIVE
Ketones, ur: 15 mg/dL — AB
Leukocytes, UA: NEGATIVE
NITRITE: NEGATIVE
Protein, ur: NEGATIVE mg/dL
SPECIFIC GRAVITY, URINE: 1.027 (ref 1.005–1.030)
UROBILINOGEN UA: 1 mg/dL (ref 0.0–1.0)
pH: 7 (ref 5.0–8.0)

## 2015-04-30 LAB — I-STAT TROPONIN, ED: TROPONIN I, POC: 0 ng/mL (ref 0.00–0.08)

## 2015-04-30 LAB — BASIC METABOLIC PANEL
Anion gap: 11 (ref 5–15)
BUN: 10 mg/dL (ref 6–20)
CHLORIDE: 101 mmol/L (ref 101–111)
CO2: 24 mmol/L (ref 22–32)
Calcium: 9.1 mg/dL (ref 8.9–10.3)
Creatinine, Ser: 0.74 mg/dL (ref 0.44–1.00)
GFR calc non Af Amer: >60 mL/min (ref >60)
Glucose, Bld: 124 mg/dL — ABNORMAL HIGH (ref 65–99)
POTASSIUM: 4.1 mmol/L (ref 3.5–5.1)
Sodium: 136 mmol/L (ref 135–145)

## 2015-04-30 LAB — I-STAT CG4 LACTIC ACID, ED: LACTIC ACID, VENOUS: 1.23 mmol/L (ref 0.5–2.0)

## 2015-04-30 LAB — POC URINE PREG, ED: Preg Test, Ur: NEGATIVE

## 2015-04-30 LAB — RAPID STREP SCREEN (MED CTR MEBANE ONLY): Streptococcus, Group A Screen (Direct): NEGATIVE

## 2015-04-30 LAB — BRAIN NATRIURETIC PEPTIDE: B Natriuretic Peptide: 10 pg/mL (ref 0.0–100.0)

## 2015-04-30 MED ORDER — ACETAMINOPHEN 500 MG PO TABS: 500.0000 mg | ORAL_TABLET | Freq: Four times a day (QID) | ORAL | Status: DC | PRN

## 2015-04-30 MED ORDER — SODIUM CHLORIDE 0.9 % IV BOLUS (SEPSIS)
1000.0000 mL | Freq: Once | INTRAVENOUS | Status: AC
  Administered 2015-04-30: 1000 mL via INTRAVENOUS

## 2015-04-30 MED ORDER — ACETAMINOPHEN 500 MG PO TABS
1000.0000 mg | ORAL_TABLET | Freq: Once | ORAL | Status: AC
  Administered 2015-04-30: 1000 mg via ORAL
  Filled 2015-04-30: qty 2

## 2015-04-30 NOTE — ED Notes (Signed)
Pt sts there is a tickling pain that makes her feel like "i need to cough" when she exhales"

## 2015-04-30 NOTE — ED Notes (Signed)
Pt back from x-ray.

## 2015-04-30 NOTE — ED Provider Notes (Signed)
CSN: [id number]     Arrival date & time 04/30/15  1720 History   First MD Initiated Contact with Patient 04/30/15 1725     Chief Complaint  Patient presents with  . Tachycardia  . Dizziness     (Consider location/radiation/quality/duration/timing/severity/associated sxs/prior Treatment) The history is provided by the patient. No language interpreter was used.   [name] is a 34 year old female with a history of asthma, fibromyalgia, interstitial cystitis, depression, hypertension who presents by EMS for feeling lightheaded, diaphoretic with tingling in her tongue, lips, upper extremities for a couple of minutes while driving today. At that time she was on the way to urgent care to have her ingrown toenail seen to. She states she began having chest pain that lasted less than 2 minutes with shortness of breath. The chest pain resolved but then returned while in the ambulance. She was given nitroglycerin and aspirin  by EMS. She states the chest pain has since resolved but was located in the center. Her shortness of breath is worse with movement. She denies any birth control or estrogen use. She denies any history of DVT or PE. She states she woke up with a sore throat this morning but denies any cough, ear pain. She denies any recent travel or surgery. She denies any fever, chills, abdominal pain, vomiting, diarrhea, constipation, dysuria, hematuria, urinary frequency, vaginal discharge. She states she started her menstrual cycle today.  Past Medical History  Diagnosis Date  . Asthma   . Fibromyalgia muscle pain   . Fibromyalgia   . Mental disorder   . Headache(784.0)     migraines  . Interstitial cystitis   . GERD (gastroesophageal reflux disease)   . Depression   . Abnormal Pap smear   . Normal pregnancy 05/16/2012  . SVD (spontaneous vaginal delivery) 05/17/2012  . PCOD (polycystic ovarian disease)   . Anxiety   . Hypertension     no meds currently   Past Surgical History   Procedure Laterality Date  . Breast reduction surgery    . Appendectomy    . Cholecystectomy     Family History  Problem Relation Age of Onset  . Hypertension Mother   . Asthma Mother   . Depression Mother   . Hypertension Maternal Grandmother   . Asthma Maternal Grandmother   . Heart attack Maternal Grandfather   . Heart disease Maternal Grandfather    History  Substance Use Topics  . Smoking status: Former Smoker    Quit date: 11/09/2008  . Smokeless tobacco: Never Used  . Alcohol Use: No   OB History    Gravida Para Term Preterm AB TAB SAB Ectopic Multiple Living   0 0 0 0 0 0 2     Review of Systems  Constitutional: Negative for fever.  HENT: Positive for sore throat.   Respiratory: Positive for shortness of breath. Negative for cough.   Cardiovascular: Positive for chest pain. Negative for palpitations and leg swelling.  Gastrointestinal: Positive for nausea. Negative for vomiting, abdominal pain and diarrhea.  All other systems reviewed and are negative.     Allergies  Sulfa antibiotics  Home Medications   Prior to Admission medications   Medication Sig Start Date End Date Taking? Authorizing Provider  albuterol (PROVENTIL HFA;VENTOLIN HFA) 108 (90 BASE) MCG/ACT inhaler Inhale 2 puffs into the lungs every 6 (six) hours as needed. For shortness of breath    Yes Historical Provider, MD  ALPRAZolam (XANAX) 0.25 MG tablet Take 0.25  mg by mouth at bedtime as needed for anxiety.   Yes Historical Provider, MD  cetirizine (ZYRTEC) 10 MG tablet Take 10 mg by mouth daily.   Yes Historical Provider, MD  fluconazole (DIFLUCAN) 150 MG tablet Take 150 mg by mouth 2 (two) times daily.   Yes Historical Provider, MD  gabapentin (NEURONTIN) 300 MG capsule Take 300 mg by mouth at bedtime.   Yes Historical Provider, MD  metFORMIN (GLUCOPHAGE) 500 MG tablet Take 500 mg by mouth 2 (two) times daily with a meal.   Yes Historical Provider, MD  nortriptyline (PAMELOR) 10 MG  capsule Take 10 mg by mouth at bedtime.   Yes Historical Provider, MD  nystatin (MYCOSTATIN) powder Apply 1 Bottle topically 4 (four) times daily.   Yes Historical Provider, MD  pantoprazole (PROTONIX) 40 MG tablet Take 40 mg by mouth 2 (two) times daily.   Yes Historical Provider, MD  acetaminophen (TYLENOL) 500 MG tablet Take 1 tablet (500 mg total) by mouth every 6 (six) hours as needed. 04/30/15   Aveena Bari Patel-Mills, PA-C  ondansetron (ZOFRAN) 4 MG tablet Take 1 tablet (4 mg total) by mouth every 6 (six) hours. Patient not taking: Reported on 04/30/2015 10/19/14   Erskine Emery, MD   BP 110/68 mmHg  Pulse 71  Temp(Src) 99.2 F (37.3 C) (Oral)  Resp 22  Ht  (1.626 m)  Wt 270 lb (122.471 kg)  BMI 46.32 kg/m2  SpO2 99%  LMP 03/07/2015 Physical Exam  Constitutional: She is oriented to person, place, and time. She appears well-developed and well-nourished.  Morbidly obese.  HENT:  Head: Normocephalic and atraumatic.  Right Ear: Tympanic membrane, external ear and ear canal normal. No drainage or tenderness.  Left Ear: Tympanic membrane, external ear and ear canal normal. No drainage or tenderness.  Nose: Nose normal. No rhinorrhea.  Mouth/Throat: Uvula is midline and mucous membranes are normal. No uvula swelling. Posterior oropharyngeal erythema present. No oropharyngeal exudate or tonsillar abscesses.  Eyes: Conjunctivae are normal.  Neck: Normal range of motion. Neck supple.  Cardiovascular: Regular rhythm and normal heart sounds.  Tachycardia present.   No murmur heard. Pulmonary/Chest: Effort normal and breath sounds normal. No respiratory distress. She has no wheezes. She has no rales.  Abdominal: Soft. She exhibits no mass. There is no tenderness. There is no rebound and no guarding.  Musculoskeletal: Normal range of motion.  Neurological: She is alert and oriented to person, place, and time. She has normal strength. No sensory deficit. GCS eye subscore is 4. GCS verbal  subscore is 5. GCS motor subscore is 6.  Good radial and DP pulses bilaterally. No calf or popliteal fossa tenderness.  Skin: Skin is warm and dry.  Left great toe ingrown toenail on the left border without surrounding edema, erythema. Mild tenderness to palpation.  Psychiatric: She has a normal mood and affect. Her behavior is normal.  Nursing note and vitals reviewed.   ED Course  Procedures (including critical care time) Labs Review Labs Reviewed  BASIC METABOLIC PANEL - Abnormal; Notable for the following:    Glucose, Bld 124 (*)    All other components within normal limits  URINALYSIS, ROUTINE W REFLEX MICROSCOPIC (NOT AT [facility]) - Abnormal; Notable for the following:    Ketones, ur 15 (*)    All other components within normal limits  RAPID STREP SCREEN (NOT AT [facility])  CULTURE, GROUP A STREP  CBC  BRAIN NATRIURETIC PEPTIDE  I-STAT TROPOININ, ED  POC URINE PREG, ED  I-STAT CG4 LACTIC ACID, ED  I-STAT CG4 LACTIC ACID, ED    Imaging Review Dg Chest 2 View  04/30/2015   CLINICAL DATA:  Tachycardia and chest pain with dizziness and shortness of breath  EXAM: CHEST  2 VIEW  COMPARISON:  01/17/2015  FINDINGS: Normal heart size and mediastinal contours. No acute infiltrate or edema. No effusion or pneumothorax. No acute osseous findings.  Cholecystectomy  IMPRESSION: No active cardiopulmonary disease.   Electronically Signed   By: Marnee Spring M.D.   On: 04/30/2015 18:28     EKG Interpretation   Date/Time:  Tuesday April 30 2015 17:30:06 EDT Ventricular Rate:  129 PR Interval:  131 QRS Duration: 83 QT Interval:  311 QTC Calculation: 456 R Axis:   8 Text Interpretation:  Sinus tachycardia Borderline T abnormalities,  lateral leads SINCE LAST TRACING HEART RATE HAS INCREASED Confirmed by  Ethelda Chick  MD, SAM [phone]) on 04/30/2015 5:35:12 PM      MDM   Final diagnoses:  Fever, unspecified fever cause  Patient presents for multiple complaints including chest pain and  shortness of breath and sore throat. Chest pain relieved with aspirin and nitroglycerin. She is now stating that she has a headache. Her shortness of breath is worse with movement. She is currently febrile and tachycardic on exam and EKG. Her troponin is negative. Her labs are unremarkable. Her chest x-ray is negative for pneumonia, pneumothorax, edema. UA is negative for infection. Negative pregnancy. The left great toe nail does not appear infected. I spoke to Dr. Rennis Chris regarding this patient and he has seen her and evaluated her. Her tachycardia is most likely due to her fever. Lactate is less than 2. I reviewed the heart score and she is at low risk for major cardiac event. I also reviewed the CENTOR criteria. Medications  acetaminophen (TYLENOL) tablet 1,000 mg (1,000 mg Oral Given 04/30/15 1852)  sodium chloride 0.9 % bolus 1,000 mL (0 mLs Intravenous Stopped 04/30/15 2043)  sodium chloride 0.9 % bolus 1,000 mL (0 mLs Intravenous Stopped 04/30/15 2043)  Recheck: Patient resting comfortably and in no acute distress. No drooling or difficulty breathing. She is sitting in bed comfortably watching TV and eating. She is currently getting 2 boluses of fluids. Her heart rate is currently 98 at bedside. Strep is negative.  I discussed follow up with a provider using the resource guide. I also gave the patient strict return precautions such as chest pain and shortness of breath. Patient verbally agrees with the plan.    Catha Gosselin, PA-C 04/30/15 2310  Doug Sou, MD 04/30/15 2352

## 2015-04-30 NOTE — ED Provider Notes (Signed)
Patient developed a pinching chest pain intermittent lasting 5 or 10 minutes at a time accompanied by tingling in her tongue and mild shortness of breath and lightheadedness 3:15 PM today feels much improved since in the emergency department. No other associated symptoms on exam no distress HEENT exam no facial asymmetry oropharynx is clear neck supple lungs clear auscultation heart mildly tachycardic regular rhythm abdomen obese nontender extremities without edema skin warm dry Patient felt to have febrile illness etiology undifferentiated presently  Doug Sou, MD 04/30/15 1859

## 2015-04-30 NOTE — ED Notes (Addendum)
Per EMS:  Pt on way to [facility] for ingrown toenail.  On way to appt pt began have nausea, numbness and tingling in her lips, dizziness and SOB.  Pt found to be tachycardic and EMS called.  Pt w/ a hx of anxiety, but has never had a panic attack before.  Pt has a hx of hyptertension and asthma.  Pt was c/o CP with EMS and was given 1 nitro en route.  Pt given 324 of ASA en route as well.  Alert and oriented in room at this time.

## 2015-04-30 NOTE — Discharge Instructions (Signed)
Fever, Adult Follow-up with a provider using the resource guide below. Return for any chest pain or shortness of breath. A fever is a temperature of 100.4 F (38 C) or above.  HOME CARE  Take fever medicine as told by your doctor. Do not  take aspirin for fever if you are younger than 32 years of age.  If you are given antibiotic medicine, take it as told. Finish the medicine even if you start to feel better.  Rest.  Drink enough fluids to keep your pee (urine) clear or pale yellow. Do not drink alcohol.  Take a bath or shower with room temperature water. Do not use ice water or alcohol sponge baths.  Wear lightweight, loose clothes. GET HELP RIGHT AWAY IF:   You are short of breath or have trouble breathing.  You are very weak.  You are dizzy or you pass out (faint).  You are very thirsty or are making little or no urine.  You have new pain.  You throw up (vomit) or have watery poop (diarrhea).  You keep throwing up or having watery poop for more than 1 to 2 days.  You have a stiff neck or light bothers your eyes.  You have a skin rash.  You have a fever or problems (symptoms) that last for more than 2 to 3 days.  You have a fever and your problems quickly get worse.  You keep throwing up the fluids you drink.  You do not feel better after 3 days.  You have new problems. MAKE SURE YOU:   Understand these instructions.  Will watch your condition.  Will get help right away if you are not doing well or get worse. Document Released: 08/04/2008 Document Revised: 01/18/2012 Document Reviewed: 08/27/2011 [facility] Patient Information 2015 [place], [place]. This information is not intended to replace advice given to you by your health care provider. Make sure you discuss any questions you have with your health care provider.  Emergency Department Resource Guide 1) Find a Doctor and Pay Out of Pocket Although you won't have to find out who is covered by your insurance  plan, it is a good idea to ask around and get recommendations. You will then need to call the office and see if the doctor you have chosen will accept you as a new patient and what types of options they offer for patients who are self-pay. Some doctors offer discounts or will set up payment plans for their patients who do not have insurance, but you will need to ask so you aren't surprised when you get to your appointment.  2) Contact Your Local Health Department Not all health departments have doctors that can see patients for sick visits, but many do, so it is worth a call to see if yours does. If you don't know where your local health department is, you can check in your phone book. The CDC also has a tool to help you locate your state's health department, and many state websites also have listings of all of their local health departments.  3) Find a Walk-in Clinic If your illness is not likely to be very severe or complicated, you may want to try a walk in clinic. These are popping up all over the country in pharmacies, drugstores, and shopping centers. They're usually staffed by nurse practitioners or physician assistants that have been trained to treat common illnesses and complaints. They're usually fairly quick and inexpensive. However, if you have serious medical issues or chronic  medical problems, these are probably not your best option.  No Primary Care Doctor: - Call Health Connect at  [phone] - they can help you locate a primary care doctor that  accepts your insurance, provides certain services, etc. - Physician Referral Service- [phone]  Chronic Pain Problems: Organization         Address  Phone   Notes  [facility]  [phone] Patients need to be referred by their primary care doctor.   Medication Assistance: Organization         Address  Phone   Notes  [facility] [address] [place] [place] [place], [place] [address]  [phone] --Must be a resident of [facility] -- Must have NO insurance coverage whatsoever (no Medicaid/ Medicare, etc.) -- The pt. MUST have a primary care doctor that directs their care regularly and follows them in the community   [facility]  [phone]   [employer]  [phone]    Agencies that provide inexpensive medical care: Organization         Address  Phone   Notes  [facility]  [phone]   [facility]    [phone]   [facility] [address] [place], [place] [address] [phone]   [facility] [address] [place], [place] [phone]   [facility]    [phone]   [facility]    [phone]   [facility]  [address], [place] Phone:  [phone], Fax:  [phone] Hours of Operation:  9 am - 6 pm, M-F.  Also accepts Medicaid/Medicare and self-pay.  [facility]  [address], [place] Phone: [phone], Fax: [phone]. Hours of Operation:  8:30 am - 5:30 pm, M-F.  Also accepts Medicaid and self-pay.  [facility] [place] [address], [place] Phone: [phone]   [facility] [address] Natasha Bence [place], [place] [phone], Ext. [phone] Mondays & Thursdays: 7-9 AM.  First 15 patients are seen on a first come, first serve basis.    Medicaid-accepting [facility] Providers:  Organization         Address  Phone   Notes  [facility] [address], [place] [phone] Also accepts self-pay patients.  [facility] [address] Laurell Josephs [place], [place]  [phone]   [facility] [address], [place] [phone]   [facility] [address] [place] [phone]   Renaye Rakers [address], [place]   [phone] Only accepts [place] Access [place] patients after they have their name applied to their card.   Self-Pay (no insurance) in [facility]:  Organization         Address  Phone   Notes  Sickle Cell Patients, [facility] Internal Medicine [address] [place], [place] [phone]   [facility] [address] [place], [place] [phone]   [facility] [place]  [address], [place] [phone]   [facility]/Dr. Osei-Bonsu  [address] [place] or [address], [place] [phone] Phone number for both [place] and [place] locations is the same.  [facility] [address] [place] [phone]   [facility] [address] [place] or [address] [phone]   [facility] [address] [place] [phone], phone; [phone], fax Sees patients 1st and 3rd Saturday of every month.  Must not qualify for public or private insurance (i.e. Medicaid, Medicare, [place] Health Choice, Veterans' Benefits)  Household income should be no more than 200% of the poverty level The clinic cannot treat you if you are pregnant or think you are pregnant  Sexually transmitted diseases are not treated at the clinic.    Dental Care: Organization         Address  Phone  Notes  [facility] of [facility] [address] [place], [place] [phone] Accepts children up to age 4 who are enrolled in [place] or [place] Health Choice; pregnant women with a Medicaid card; and children who have applied for Medicaid or [place] Health Choice, but were declined, whose parents can pay a reduced fee at time of service.  [facility] Department of [facility]  [address], [place] [phone] Accepts children up to age 71 who are enrolled in [place] or [place] Health  Choice; pregnant women with a Medicaid card; and children who have applied for Medicaid or [place] Health Choice, but were declined, whose parents can pay a reduced fee at time of service.  [facility]  [address] [place], [place] [phone] Patients are seen by appointment only. Walk-ins are not accepted. [facility] will see patients 85 years of age and older. Monday - Tuesday (8am-5pm) Most Wednesdays (8:30-5pm) $30 per visit, cash only  [facility] Adult Dental Access PROGRAM  [address], [facility] [phone] Patients are seen by appointment only. Walk-ins are not accepted. [facility] will see patients 50 years of age and older. One Wednesday Evening (Monthly: Volunteer Based).  $30 per visit, cash only  [employer]  [phone] for adults; Children under age 16, call Graduate Pediatric Dentistry at [phone]. Children aged 79-14, please call [phone] to request a pediatric application.  Dental services are provided in all areas of dental care including fillings, crowns and bridges, complete and partial dentures, implants, gum treatment, root canals, and extractions. Preventive care is also provided. Treatment is provided to both adults and children. Patients are selected via a lottery and there is often a waiting list.   [facility] [address], [place]  [phone] [email or web address]   [facility] [address] [place], [place] [phone], Ext. [phone] Second and Fourth Thursday of each month, opens at 6:30 AM; Clinic ends at 9 AM.  Patients are seen on a first-come first-served basis, and a limited number are seen during each clinic.   [facility]  [address] Ether Griffins [place], [place] [phone]   Eligibility Requirements You must have lived in [place], [place], or [place] counties for at least the last three months.   You cannot be eligible for state or  federal sponsored [place], including [employer], [place], or [employer].   You generally cannot be eligible for healthcare insurance through your employer.    How to apply: Eligibility screenings are held every Tuesday and Wednesday afternoon from 1:00 pm until 4:00 pm. You do not need an appointment for the interview!  [facility] [address], [place], [place] [phone]   [facility] Health Department  [phone]   [facility]  [phone]   [facility]  [phone]    Behavioral Health Resources in the Community: Intensive Outpatient Programs Organization         Address  Phone  Notes  [facility] Services [address] [place], [place] [phone]   [facility] Outpatient [address], [place], [place] [phone]   [facility] [address] [place], [place]  [phone]   [facility] Mental Health [address],  [place], [place] [phone] or [phone]   Substance Abuse Resources Organization         Address  Phone  Notes  [facility]  [phone]   [facility]  [phone]   [facility] [place]  [phone]   Floydene Flock  [phone]   Residential & Outpatient Substance Abuse Program  [phone]   Psychological Services Organization         Address  Phone  Notes  [facility] Behavioral Health  [phone]   [facility]  [phone]   [facility] Mental Health [address] [place] [phone] or [phone]    Mobile Crisis Teams Organization         Address  Phone  Notes  [facility], Mobile Crisis Care Unit  [phone]   [facility]  [address] [place], [place] [phone]   Doristine Locks [address] [place] [phone]    Self-Help/Support Groups Organization         Address  Phone              Notes  [facility] [place] - variety of support groups  [phone]- [phone] Call for more information  Narcotics Anonymous (NA), [facility] [address], [employer] [place]  2 meetings at this location   Statistician         Address  Phone  Notes  [facility] [address],    [place]  [phone]   [facility]  [address], [place] [address], [place], [place] [phone]   [facility] [address] [place], [place] [phone] Admissions: 8am-3pm M-F  [facility] [address],    [place], [place] [phone]   [facility] [address] [place], [place], [place] [phone]   [facility] [address]  [place], [place] [phone]   [facility] - Intensive Outpatient [address] Laurell Josephs [address], [place], [place] [phone]   [facility] ([facility] [address] [place]  [place], [place] [phone] or [phone]   [facility] ([facility]) [address], [place], [place] [phone] Accepts Medicaid  [facility] [place] [address],  [place] [phone] Substance Abuse/Addiction Treatment   [facility] Organization         Address  Phone  Notes  [facility]  [phone]   Angie Fava, PhD [address] Ervin Knack [place], [place]   [phone] or [phone]   [facility] Behavioral   [address] [place], [place] [phone]   [facility] [address] [place], [place] [phone] Insurance/Medicaid/sponsorship through [employer] and Families [address]  [place], [place] [phone] [place], [place] [phone]    Dr. Adele Schilder  [phone]   [facility]  Dept. 1) [address] [place] 2) [place] 3)  [address], [place] [phone]  [phone]   [place] [phone] or [phone] (After Hours)

## 2015-04-30 NOTE — ED Notes (Signed)
MD at bedside. 

## 2015-05-03 LAB — CULTURE, GROUP A STREP

## 2015-07-31 ENCOUNTER — Other Ambulatory Visit: Payer: Self-pay | Admitting: Podiatry

## 2015-10-04 ENCOUNTER — Emergency Department (HOSPITAL_BASED_OUTPATIENT_CLINIC_OR_DEPARTMENT_OTHER)
Admission: EM | Admit: 2015-10-04 | Discharge: 2015-10-04 | Disposition: A | Discharge status: Home / Self Care | Payer: BLUE CROSS/BLUE SHIELD | Attending: Emergency Medicine | Admitting: Emergency Medicine

## 2015-10-04 ENCOUNTER — Encounter (HOSPITAL_BASED_OUTPATIENT_CLINIC_OR_DEPARTMENT_OTHER): Payer: Self-pay

## 2015-10-04 ENCOUNTER — Emergency Department (HOSPITAL_BASED_OUTPATIENT_CLINIC_OR_DEPARTMENT_OTHER): Payer: BLUE CROSS/BLUE SHIELD

## 2015-10-04 DIAGNOSIS — Z87891 Personal history of nicotine dependence: Secondary | ICD-10-CM | POA: Diagnosis not present

## 2015-10-04 DIAGNOSIS — F329 Major depressive disorder, single episode, unspecified: Secondary | ICD-10-CM | POA: Insufficient documentation

## 2015-10-04 DIAGNOSIS — Y9389 Activity, other specified: Secondary | ICD-10-CM | POA: Diagnosis not present

## 2015-10-04 DIAGNOSIS — Z87448 Personal history of other diseases of urinary system: Secondary | ICD-10-CM | POA: Insufficient documentation

## 2015-10-04 DIAGNOSIS — G43909 Migraine, unspecified, not intractable, without status migrainosus: Secondary | ICD-10-CM | POA: Diagnosis not present

## 2015-10-04 DIAGNOSIS — M797 Fibromyalgia: Secondary | ICD-10-CM | POA: Insufficient documentation

## 2015-10-04 DIAGNOSIS — J45909 Unspecified asthma, uncomplicated: Secondary | ICD-10-CM | POA: Insufficient documentation

## 2015-10-04 DIAGNOSIS — Y998 Other external cause status: Secondary | ICD-10-CM | POA: Insufficient documentation

## 2015-10-04 DIAGNOSIS — I10 Essential (primary) hypertension: Secondary | ICD-10-CM | POA: Diagnosis not present

## 2015-10-04 DIAGNOSIS — Y9289 Other specified places as the place of occurrence of the external cause: Secondary | ICD-10-CM | POA: Diagnosis not present

## 2015-10-04 DIAGNOSIS — Z7984 Long term (current) use of oral hypoglycemic drugs: Secondary | ICD-10-CM | POA: Diagnosis not present

## 2015-10-04 DIAGNOSIS — W108XXA Fall (on) (from) other stairs and steps, initial encounter: Secondary | ICD-10-CM | POA: Insufficient documentation

## 2015-10-04 DIAGNOSIS — S0990XA Unspecified injury of head, initial encounter: Secondary | ICD-10-CM | POA: Diagnosis not present

## 2015-10-04 DIAGNOSIS — Z79899 Other long term (current) drug therapy: Secondary | ICD-10-CM | POA: Diagnosis not present

## 2015-10-04 DIAGNOSIS — K219 Gastro-esophageal reflux disease without esophagitis: Secondary | ICD-10-CM | POA: Diagnosis not present

## 2015-10-04 DIAGNOSIS — S0083XA Contusion of other part of head, initial encounter: Secondary | ICD-10-CM | POA: Insufficient documentation

## 2015-10-04 DIAGNOSIS — F419 Anxiety disorder, unspecified: Secondary | ICD-10-CM | POA: Diagnosis not present

## 2015-10-04 DIAGNOSIS — S0993XA Unspecified injury of face, initial encounter: Secondary | ICD-10-CM | POA: Diagnosis present

## 2015-10-04 MED ORDER — NAPROXEN 500 MG PO TABS: 500.0000 mg | ORAL_TABLET | Freq: Two times a day (BID) | ORAL | Status: DC

## 2015-10-04 NOTE — ED Provider Notes (Signed)
CSN: [id number]     Arrival date & time 10/04/15  1236 History   First MD Initiated Contact with Patient 10/04/15 1318     Chief Complaint  Patient presents with  . Fall     (Consider location/radiation/quality/duration/timing/severity/associated sxs/prior Treatment) HPI Comments: Patient presents after a fall which occurred approximately 8 PM last night. Patient was walking down a set of stairs and tripped, striking the right side of her face on a cement wall and falling down several stairs. She denies loss of consciousness. Patient took over-the-counter medications prior to arrival with some relief. Patient complains of headache, no vomiting, difficulty walking, confusion. Her main complaint is right-sided facial pain, worse with opening or closing her mouth, chewing. No vision change or difficulty. No neck pain. No injuries to her extremities, chest or abdomen. Onset of symptoms acute. Nothing makes symptoms better.  Patient is a 34 y.o. female presenting with fall. The history is provided by the patient.  Fall Associated symptoms include headaches. Pertinent negatives include no chest pain, fatigue, nausea, neck pain, numbness, vomiting or weakness.    Past Medical History  Diagnosis Date  . Asthma   . Fibromyalgia muscle pain   . Fibromyalgia   . Mental disorder   . Headache(784.0)     migraines  . Interstitial cystitis   . GERD (gastroesophageal reflux disease)   . Depression   . Abnormal Pap smear   . Normal pregnancy 05/16/2012  . SVD (spontaneous vaginal delivery) 05/17/2012  . PCOD (polycystic ovarian disease)   . Anxiety   . Hypertension     no meds currently   Past Surgical History  Procedure Laterality Date  . Breast reduction surgery    . Appendectomy    . Cholecystectomy     Family History  Problem Relation Age of Onset  . Hypertension Mother   . Asthma Mother   . Depression Mother   . Hypertension Maternal Grandmother   . Asthma Maternal Grandmother   .  Heart attack Maternal Grandfather   . Heart disease Maternal Grandfather    Social History  Substance Use Topics  . Smoking status: Former Smoker    Quit date: 11/09/2008  . Smokeless tobacco: Never Used  . Alcohol Use: No   OB History    Gravida Para Term Preterm AB TAB SAB Ectopic Multiple Living   0 0 0 0 0 0 2     Review of Systems  Constitutional: Negative for fatigue.  HENT: Negative for ear pain, facial swelling and tinnitus.        Positive for facial pain.  Eyes: Negative for photophobia, pain and visual disturbance.  Respiratory: Negative for shortness of breath.   Cardiovascular: Negative for chest pain.  Gastrointestinal: Negative for nausea and vomiting.  Musculoskeletal: Negative for back pain, gait problem and neck pain.  Skin: Negative for wound.  Neurological: Positive for headaches. Negative for dizziness, weakness, light-headedness and numbness.  Psychiatric/Behavioral: Negative for confusion and decreased concentration.      Allergies  Sulfa antibiotics  Home Medications   Prior to Admission medications   Medication Sig Start Date End Date Taking? Authorizing Provider  DICYCLOMINE HCL PO Take by mouth.   Yes Historical Provider, MD  LISINOPRIL PO Take by mouth.   Yes Historical Provider, MD  METOPROLOL TARTRATE PO Take by mouth.   Yes Historical Provider, MD  nitroGLYCERIN (NITROSTAT) 0.3 MG SL tablet Place 0.3 mg under the tongue every 5 (five) minutes as needed for  chest pain.   Yes Historical Provider, MD  TIZANIDINE HCL PO Take by mouth.   Yes Historical Provider, MD  Vitamin D, Ergocalciferol, (DRISDOL) 50000 UNITS CAPS capsule Take 50,000 Units by mouth every 7 (seven) days.   Yes Historical Provider, MD  albuterol (PROVENTIL HFA;VENTOLIN HFA) 108 (90 BASE) MCG/ACT inhaler Inhale 2 puffs into the lungs every 6 (six) hours as needed. For shortness of breath     Historical Provider, MD  ALPRAZolam (XANAX) 0.25 MG tablet Take 0.25 mg by mouth  at bedtime as needed for anxiety.    Historical Provider, MD  cetirizine (ZYRTEC) 10 MG tablet Take 10 mg by mouth daily.    Historical Provider, MD  gabapentin (NEURONTIN) 300 MG capsule Take 300 mg by mouth at bedtime.    Historical Provider, MD  metFORMIN (GLUCOPHAGE) 500 MG tablet Take 500 mg by mouth 2 (two) times daily with a meal.    Historical Provider, MD  nortriptyline (PAMELOR) 10 MG capsule Take 10 mg by mouth at bedtime.    Historical Provider, MD  nystatin (MYCOSTATIN) powder Apply 1 Bottle topically 4 (four) times daily.    Historical Provider, MD  pantoprazole (PROTONIX) 40 MG tablet Take 40 mg by mouth 2 (two) times daily.    Historical Provider, MD   BP 123/96 mmHg  Pulse 90  Temp(Src) 98.4 F (36.9 C) (Oral)  Resp 16  Ht  (1.626 m)  Wt 123.832 kg  BMI 46.84 kg/m2  SpO2 99%  LMP 10/01/2015 Physical Exam  Constitutional: She is oriented to person, place, and time. She appears well-developed and well-nourished.  HENT:  Head: Atraumatic. Head is without raccoon's eyes and without Battle's sign.  Right Ear: Tympanic membrane, external ear and ear canal normal. No hemotympanum.  Left Ear: Tympanic membrane, external ear and ear canal normal. No hemotympanum.  Nose: Nose normal. No nasal septal hematoma.  Mouth/Throat: Uvula is midline, oropharynx is clear and moist and mucous membranes are normal.  No malocclusion the patient states that it feels as though her teeth are not coming back together as usual. She has pain with movement of the jaw on the right TM. No palpable deformities.  Eyes: Conjunctivae, EOM and lids are normal. Pupils are equal, round, and reactive to light. Right eye exhibits no nystagmus. Left eye exhibits no nystagmus.  No visible hyphema noted  Neck: Normal range of motion. Neck supple.  Cardiovascular: Normal rate and regular rhythm.   Pulmonary/Chest: Effort normal and breath sounds normal.  Abdominal: Soft. There is no tenderness.   Musculoskeletal:       Cervical back: She exhibits normal range of motion, no tenderness and no bony tenderness.       Thoracic back: She exhibits no tenderness and no bony tenderness.       Lumbar back: She exhibits no tenderness and no bony tenderness.  Neurological: She is alert and oriented to person, place, and time. She has normal strength and normal reflexes. No cranial nerve deficit or sensory deficit. Coordination normal. GCS eye subscore is 4. GCS verbal subscore is 5. GCS motor subscore is 6.  Skin: Skin is warm and dry.  Psychiatric: She has a normal mood and affect.  Nursing note and vitals reviewed.   ED Course  Procedures (including critical care time) Labs Review Labs Reviewed - No data to display  Imaging Review Ct Maxillofacial Wo Cm  10/04/2015  CLINICAL DATA:  Right-sided face pain status post fall. EXAM: CT MAXILLOFACIAL WITHOUT CONTRAST TECHNIQUE:  Multidetector CT imaging of the maxillofacial structures was performed. Multiplanar CT image reconstructions were also generated. A small metallic BB was placed on the right temple in order to reliably differentiate right from left. COMPARISON:  None. FINDINGS: The globes and extraocular muscles appear symmetrical. No air fluid levels in the paranasal sinuses. The frontal bones, orbital rims, maxillary antral walls, nasal bones, nasal septum, nasal spine, maxilla, pterygoid plates, zygomatic arches, temporomandibular joints, and mandibles appear intact. There is a leftward deviation of the nasal septum. No displaced fractures are identified. Visualized thyroid cartilage and hyoid bone appear intact. IMPRESSION: No evidence of facial bone fracture. Electronically Signed   By: Ted Mcalpineobrinka  Dimitrova M.D.   On: 10/04/2015 14:12   I have personally reviewed and evaluated these images and lab results as part of my medical decision-making.   EKG Interpretation None       Patient seen and examined. Work-up initiated.   Vital signs  reviewed and are as follows: BP 123/96 mmHg  Pulse 90  Temp(Src) 98.4 F (36.9 C) (Oral)  Resp 16  Ht 5\' 4"  (1.626 m)  Wt 123.832 kg  BMI 46.84 kg/m2  SpO2 99%  LMP 10/01/2015  2:19 PM imaging is negative without fracture demonstrated. Patient to continue muscle relaxer. Encouraged to take NSAIDs over the next few days for muscle/jaw soreness.  Patient was counseled on head injury precautions and symptoms that should indicate their return to the ED.  These include severe worsening headache, vision changes, confusion, loss of consciousness, trouble walking, nausea & vomiting, or weakness/tingling in extremities.      MDM   Final diagnoses:  Facial contusion, initial encounter  Minor head injury, initial encounter   Patient contusion: Imaging negative, no dental injury. No jaw fracture. Likely contusion/muscle strain.   Minor head injury: No dedicated head CT indicated per [place] head CT rules. Patient with no concerning symptoms of closed head injury.    Renne CriglerJoshua Dawnelle Warman, PA-C 10/04/15 1423  Melene Planan Floyd, DO 10/04/15 1550

## 2015-10-04 NOTE — Discharge Instructions (Signed)
Please read and follow all provided instructions.  Your diagnoses today include:  1. Facial contusion, initial encounter   2. Minor head injury, initial encounter     Tests performed today include:  CT scan of your face that did not show any serious injury or broken bones  Vital signs. See below for your results today.   Medications prescribed:   Naproxen - anti-inflammatory pain medication  Do not exceed 500mg  naproxen every 12 hours, take with food  You have been prescribed an anti-inflammatory medication or NSAID. Take with food. Take smallest effective dose for the shortest duration needed for your pain. Stop taking if you experience stomach pain or vomiting.   Take any prescribed medications only as directed.  Home care instructions:  Follow any educational materials contained in this packet.  BE VERY CAREFUL not to take multiple medicines containing Tylenol (also called acetaminophen). Doing so can lead to an overdose which can damage your liver and cause liver failure and possibly death.   Follow-up instructions: Please follow-up with your primary care provider in the next 3 days for further evaluation of your symptoms.   Return instructions:  SEEK IMMEDIATE MEDICAL ATTENTION IF:  There is confusion or drowsiness (although children frequently become drowsy after injury).   You cannot awaken the injured person.   You have more than one episode of vomiting.   You notice dizziness or unsteadiness which is getting worse, or inability to walk.   You have convulsions or unconsciousness.   You experience severe, persistent headaches not relieved by Tylenol.  You cannot use arms or legs normally.   There are changes in pupil sizes. (This is the black center in the colored part of the eye)   There is clear or bloody discharge from the nose or ears.   You have change in speech, vision, swallowing, or understanding.   Localized weakness, numbness, tingling, or change  in bowel or bladder control.  You have any other emergent concerns.  Additional Information: You have had a head injury which does not appear to require admission at this time.  Your vital signs today were: BP 123/96 mmHg   Pulse 90   Temp(Src) 98.4 F (36.9 C) (Oral)   Resp 16   Ht 5\' 4"  (1.626 m)   Wt 123.832 kg   BMI 46.84 kg/m2   SpO2 99%   LMP 10/01/2015 If your blood pressure (BP) was elevated above 135/85 this visit, please have this repeated by your doctor within one month. --------------

## 2015-10-04 NOTE — ED Notes (Signed)
Tripped/fell down steps yesterday-pain to right temporal area-no break in skin noted-right hip pain-denies LOC-steady gait to triage

## 2016-05-28 ENCOUNTER — Other Ambulatory Visit: Payer: Self-pay

## 2016-05-28 DIAGNOSIS — Z1231 Encounter for screening mammogram for malignant neoplasm of breast: Secondary | ICD-10-CM

## 2016-06-16 ENCOUNTER — Other Ambulatory Visit (HOSPITAL_COMMUNITY): Payer: Self-pay | Admitting: *Deleted

## 2016-06-16 DIAGNOSIS — R2231 Localized swelling, mass and lump, right upper limb: Secondary | ICD-10-CM

## 2016-07-02 ENCOUNTER — Other Ambulatory Visit: Payer: BLUE CROSS/BLUE SHIELD

## 2016-07-03 ENCOUNTER — Encounter (HOSPITAL_COMMUNITY): Payer: Self-pay

## 2016-07-03 ENCOUNTER — Ambulatory Visit (HOSPITAL_COMMUNITY)
Admission: RE | Admit: 2016-07-03 | Discharge: 2016-07-03 | Disposition: A | Discharge status: Home / Self Care | Payer: Self-pay | Source: Ambulatory Visit | Attending: Obstetrics and Gynecology | Admitting: Obstetrics and Gynecology

## 2016-07-03 ENCOUNTER — Ambulatory Visit
Admission: RE | Admit: 2016-07-03 | Discharge: 2016-07-03 | Disposition: A | Discharge status: Home / Self Care | Payer: No Typology Code available for payment source | Source: Ambulatory Visit | Attending: Obstetrics and Gynecology | Admitting: Obstetrics and Gynecology

## 2016-07-03 ENCOUNTER — Ambulatory Visit (HOSPITAL_COMMUNITY): Payer: BLUE CROSS/BLUE SHIELD

## 2016-07-03 VITALS — BP 112/80 | Temp 98.7°F | Ht 64.0 in | Wt 263.8 lb

## 2016-07-03 DIAGNOSIS — Z01419 Encounter for gynecological examination (general) (routine) without abnormal findings: Secondary | ICD-10-CM

## 2016-07-03 DIAGNOSIS — R2231 Localized swelling, mass and lump, right upper limb: Secondary | ICD-10-CM

## 2016-07-03 HISTORY — DX: Prediabetes: R73.03

## 2016-07-03 HISTORY — DX: Polycystic ovarian syndrome: E28.2

## 2016-07-03 HISTORY — DX: Irritable bowel syndrome, unspecified: K58.9

## 2016-07-03 NOTE — Patient Instructions (Signed)
Explained breast self awareness with Rella LarveSarah Crossley. Let patient know that her next Pap smear will be due in one year due to her history of abnormal Pap smears and only having one normal Pap smear since last abnormal Pap smear. [facility] and right breast ultrasound. Appointment scheduled for Friday, July 03, 2016 at 1400. Let patient know will follow up with her within the next couple weeks with results of Pap smear by phone. Rella LarveSarah Karl verbalized understanding.  Aarron Wierzbicki, Kathaleen Maserhristine Poll, RN 3:00 PM

## 2016-07-03 NOTE — Progress Notes (Signed)
Complaints of right axillary lump x more than four months.   Pap Smear: Pap smear completed today. [facility] and normal per patient. Per patient has a history of two abnormal Pap smears. The first abnormal was in 2007 and the second one in 2012. Patient stated had a repeat Pap smear following both abnormal Pap smears that were normal. No Pap smear results are in EPIC.  Physical exam: Breasts Breasts symmetrical. Scars under bilateral breasts due to history of breast reduction surgery. No nipple retraction bilateral breasts. No nipple discharge bilateral breasts. No lymphadenopathy. No lumps palpated bilateral breasts. Palpated a lump versus skin thickening within the right axilla. No complaints of pain or tenderness on exam. [facility] mamm[facility] and right breast ultrasound. Appointment scheduled for Friday, July 03, 2016 at 1400.  Pelvic/Bimanual   Ext Genitalia No lesions, no swelling and no discharge observed on external genitalia.         Vagina Vagina pink and normal texture. No lesions or discharge observed in vagina.          Cervix Cervix is present. Cervix pink and of normal texture. No discharge observed.     Uterus Uterus is present and palpable. Uterus in normal position and normal size.        Adnexae Bilateral ovaries present and palpable. No tenderness on palpation.          Rectovaginal No rectal exam completed today since patient had no rectal complaints. No skin abnormalities observed on exam.    Smoking History: Patient has never smoked.  Patient Navigation: Patient education provided. [facility].

## 2016-07-06 ENCOUNTER — Encounter (HOSPITAL_COMMUNITY): Payer: Self-pay | Admitting: *Deleted

## 2016-07-06 LAB — CYTOLOGY - PAP

## 2016-07-14 ENCOUNTER — Telehealth (HOSPITAL_COMMUNITY): Payer: Self-pay | Admitting: *Deleted

## 2016-07-14 NOTE — Telephone Encounter (Signed)
Telephoned patient at home number and left message to return call to [facility] 

## 2016-07-28 ENCOUNTER — Telehealth (HOSPITAL_COMMUNITY): Payer: Self-pay | Admitting: *Deleted

## 2016-07-28 NOTE — Telephone Encounter (Signed)
Telephoned patient at home number and discussed negative pap smear results. HPV was negative. Next pap smear due in five years. Patient voice understanding.

## 2016-08-22 ENCOUNTER — Emergency Department (HOSPITAL_BASED_OUTPATIENT_CLINIC_OR_DEPARTMENT_OTHER)
Admission: EM | Admit: 2016-08-22 | Discharge: 2016-08-22 | Disposition: A | Discharge status: Home / Self Care | Payer: No Typology Code available for payment source | Attending: Emergency Medicine | Admitting: Emergency Medicine

## 2016-08-22 ENCOUNTER — Emergency Department (HOSPITAL_BASED_OUTPATIENT_CLINIC_OR_DEPARTMENT_OTHER): Payer: No Typology Code available for payment source

## 2016-08-22 ENCOUNTER — Encounter (HOSPITAL_BASED_OUTPATIENT_CLINIC_OR_DEPARTMENT_OTHER): Payer: Self-pay | Admitting: Emergency Medicine

## 2016-08-22 DIAGNOSIS — J45909 Unspecified asthma, uncomplicated: Secondary | ICD-10-CM | POA: Insufficient documentation

## 2016-08-22 DIAGNOSIS — Z7984 Long term (current) use of oral hypoglycemic drugs: Secondary | ICD-10-CM | POA: Insufficient documentation

## 2016-08-22 DIAGNOSIS — I10 Essential (primary) hypertension: Secondary | ICD-10-CM | POA: Diagnosis not present

## 2016-08-22 DIAGNOSIS — Z7951 Long term (current) use of inhaled steroids: Secondary | ICD-10-CM | POA: Diagnosis not present

## 2016-08-22 DIAGNOSIS — M546 Pain in thoracic spine: Secondary | ICD-10-CM | POA: Diagnosis present

## 2016-08-22 DIAGNOSIS — Z79899 Other long term (current) drug therapy: Secondary | ICD-10-CM | POA: Diagnosis not present

## 2016-08-22 DIAGNOSIS — R51 Headache: Secondary | ICD-10-CM | POA: Insufficient documentation

## 2016-08-22 DIAGNOSIS — Y999 Unspecified external cause status: Secondary | ICD-10-CM | POA: Diagnosis not present

## 2016-08-22 DIAGNOSIS — Y92410 Unspecified street and highway as the place of occurrence of the external cause: Secondary | ICD-10-CM | POA: Insufficient documentation

## 2016-08-22 DIAGNOSIS — Y9389 Activity, other specified: Secondary | ICD-10-CM | POA: Insufficient documentation

## 2016-08-22 DIAGNOSIS — Z87891 Personal history of nicotine dependence: Secondary | ICD-10-CM | POA: Diagnosis not present

## 2016-08-22 DIAGNOSIS — Z792 Long term (current) use of antibiotics: Secondary | ICD-10-CM | POA: Diagnosis not present

## 2016-08-22 MED ORDER — IBUPROFEN 800 MG PO TABS
800.0000 mg | ORAL_TABLET | Freq: Once | ORAL | Status: AC
  Administered 2016-08-22: 800 mg via ORAL
  Filled 2016-08-22: qty 1

## 2016-08-22 MED ORDER — CYCLOBENZAPRINE HCL 10 MG PO TABS: 10.0000 mg | ORAL_TABLET | Freq: Two times a day (BID) | ORAL | 0 refills | Status: DC | PRN

## 2016-08-22 MED ORDER — IBUPROFEN 800 MG PO TABS: 800.0000 mg | ORAL_TABLET | Freq: Three times a day (TID) | ORAL | 0 refills | Status: DC

## 2016-08-22 MED ORDER — ACETAMINOPHEN 325 MG PO TABS
650.0000 mg | ORAL_TABLET | Freq: Once | ORAL | Status: AC
  Administered 2016-08-22: 650 mg via ORAL
  Filled 2016-08-22: qty 2
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
## 2016-08-22 NOTE — ED Notes (Signed)
Pt given ice pack for comfort.  
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
## 2017-02-26 ENCOUNTER — Other Ambulatory Visit: Payer: Self-pay | Admitting: Otolaryngology

## 2017-02-26 DIAGNOSIS — H9311 Tinnitus, right ear: Secondary | ICD-10-CM

## 2017-03-15 ENCOUNTER — Ambulatory Visit
Admission: RE | Admit: 2017-03-15 | Discharge: 2017-03-15 | Disposition: A | Discharge status: Home / Self Care | Payer: BLUE CROSS/BLUE SHIELD | Source: Ambulatory Visit | Attending: Otolaryngology | Admitting: Otolaryngology

## 2017-03-15 DIAGNOSIS — H9311 Tinnitus, right ear: Secondary | ICD-10-CM

## 2017-03-15 MED ORDER — GADOBENATE DIMEGLUMINE 529 MG/ML IV SOLN
20.0000 mL | Freq: Once | INTRAVENOUS | Status: AC | PRN
  Administered 2017-03-15: 20 mL via INTRAVENOUS

## 2017-03-30 ENCOUNTER — Emergency Department (HOSPITAL_BASED_OUTPATIENT_CLINIC_OR_DEPARTMENT_OTHER)
Admission: EM | Admit: 2017-03-30 | Discharge: 2017-03-30 | Disposition: A | Discharge status: Home / Self Care | Payer: BLUE CROSS/BLUE SHIELD | Attending: Emergency Medicine | Admitting: Emergency Medicine

## 2017-03-30 ENCOUNTER — Emergency Department (HOSPITAL_BASED_OUTPATIENT_CLINIC_OR_DEPARTMENT_OTHER): Payer: BLUE CROSS/BLUE SHIELD

## 2017-03-30 ENCOUNTER — Encounter (HOSPITAL_BASED_OUTPATIENT_CLINIC_OR_DEPARTMENT_OTHER): Payer: Self-pay | Admitting: *Deleted

## 2017-03-30 DIAGNOSIS — R1084 Generalized abdominal pain: Secondary | ICD-10-CM

## 2017-03-30 DIAGNOSIS — K648 Other hemorrhoids: Secondary | ICD-10-CM | POA: Diagnosis not present

## 2017-03-30 DIAGNOSIS — I10 Essential (primary) hypertension: Secondary | ICD-10-CM | POA: Insufficient documentation

## 2017-03-30 DIAGNOSIS — Z87891 Personal history of nicotine dependence: Secondary | ICD-10-CM | POA: Diagnosis not present

## 2017-03-30 DIAGNOSIS — J45909 Unspecified asthma, uncomplicated: Secondary | ICD-10-CM | POA: Diagnosis not present

## 2017-03-30 LAB — COMPREHENSIVE METABOLIC PANEL
ALT: 22 U/L (ref 14–54)
AST: 18 U/L (ref 15–41)
Albumin: 4.5 g/dL (ref 3.5–5.0)
Alkaline Phosphatase: 57 U/L (ref 38–126)
Anion gap: 8 (ref 5–15)
BUN: 14 mg/dL (ref 6–20)
CO2: 25 mmol/L (ref 22–32)
Calcium: 9.3 mg/dL (ref 8.9–10.3)
Chloride: 103 mmol/L (ref 101–111)
Creatinine, Ser: 0.74 mg/dL (ref 0.44–1.00)
GFR calc Af Amer: >60 mL/min (ref >60)
GFR calc non Af Amer: >60 mL/min (ref >60)
GLUCOSE: 130 mg/dL — AB (ref 65–99)
Potassium: 4.1 mmol/L (ref 3.5–5.1)
Sodium: 136 mmol/L (ref 135–145)
Total Bilirubin: 0.5 mg/dL (ref 0.3–1.2)
Total Protein: 7.9 g/dL (ref 6.5–8.1)

## 2017-03-30 LAB — URINALYSIS, ROUTINE W REFLEX MICROSCOPIC
BILIRUBIN URINE: NEGATIVE
Glucose, UA: NEGATIVE mg/dL
HGB URINE DIPSTICK: NEGATIVE
Ketones, ur: NEGATIVE mg/dL
Nitrite: NEGATIVE
PROTEIN: NEGATIVE mg/dL
Specific Gravity, Urine: 1.025 (ref 1.005–1.030)
pH: 5.5 (ref 5.0–8.0)

## 2017-03-30 LAB — CBC WITH DIFFERENTIAL/PLATELET
BASOS ABS: 0 10*3/uL (ref 0.0–0.1)
BASOS PCT: 0 %
EOS PCT: 2 %
Eosinophils Absolute: 0.2 10*3/uL (ref 0.0–0.7)
HCT: 41.9 % (ref 36.0–46.0)
Hemoglobin: 14.6 g/dL (ref 12.0–15.0)
LYMPHS PCT: 29 %
Lymphs Abs: 3.3 10*3/uL (ref 0.7–4.0)
MCH: 29.6 pg (ref 26.0–34.0)
MCHC: 34.8 g/dL (ref 30.0–36.0)
MCV: 84.8 fL (ref 78.0–100.0)
Monocytes Absolute: 0.5 10*3/uL (ref 0.1–1.0)
Monocytes Relative: 5 %
Neutro Abs: 7.5 10*3/uL (ref 1.7–7.7)
Neutrophils Relative %: 64 %
Platelets: 297 10*3/uL (ref 150–400)
RBC: 4.94 MIL/uL (ref 3.87–5.11)
RDW: 13.7 % (ref 11.5–15.5)
WBC: 11.6 10*3/uL — ABNORMAL HIGH (ref 4.0–10.5)

## 2017-03-30 LAB — URINALYSIS, MICROSCOPIC (REFLEX)

## 2017-03-30 LAB — OCCULT BLOOD X 1 CARD TO LAB, STOOL: Fecal Occult Bld: POSITIVE — AB

## 2017-03-30 LAB — PREGNANCY, URINE: Preg Test, Ur: NEGATIVE

## 2017-03-30 LAB — LIPASE, BLOOD: LIPASE: 22 U/L (ref 11–51)

## 2017-03-30 MED ORDER — HYDROMORPHONE HCL 1 MG/ML IJ SOLN
1.0000 mg | Freq: Once | INTRAMUSCULAR | Status: AC
  Administered 2017-03-30: 1 mg via INTRAVENOUS
  Filled 2017-03-30: qty 1

## 2017-03-30 MED ORDER — PROMETHAZINE HCL 25 MG PO TABS: 25.0000 mg | ORAL_TABLET | Freq: Four times a day (QID) | ORAL | 0 refills | Status: DC | PRN

## 2017-03-30 MED ORDER — RANITIDINE HCL 150 MG PO TABS: 150.0000 mg | ORAL_TABLET | Freq: Two times a day (BID) | ORAL | 0 refills | Status: DC

## 2017-03-30 MED ORDER — SODIUM CHLORIDE 0.9 % IV BOLUS (SEPSIS)
1000.0000 mL | Freq: Once | INTRAVENOUS | Status: AC
  Administered 2017-03-30: 1000 mL via INTRAVENOUS

## 2017-03-30 MED ORDER — IOPAMIDOL (ISOVUE-300) INJECTION 61%
100.0000 mL | Freq: Once | INTRAVENOUS | Status: AC | PRN
  Administered 2017-03-30: 100 mL via INTRAVENOUS

## 2017-03-30 MED ORDER — DICYCLOMINE HCL 20 MG PO TABS: 20.0000 mg | ORAL_TABLET | Freq: Three times a day (TID) | ORAL | 0 refills | Status: AC

## 2017-03-30 MED ORDER — ONDANSETRON HCL 4 MG/2ML IJ SOLN
4.0000 mg | Freq: Once | INTRAMUSCULAR | Status: AC
  Administered 2017-03-30: 4 mg via INTRAVENOUS
  Filled 2017-03-30: qty 2

## 2017-03-30 MED ORDER — DIPHENHYDRAMINE HCL 50 MG/ML IJ SOLN
25.0000 mg | Freq: Once | INTRAMUSCULAR | Status: AC
  Administered 2017-03-30: 25 mg via INTRAVENOUS
  Filled 2017-03-30: qty 1

## 2017-03-30 MED ORDER — GI COCKTAIL ~~LOC~~
30.0000 mL | Freq: Once | ORAL | Status: AC
  Administered 2017-03-30: 30 mL via ORAL
  Filled 2017-03-30: qty 30

## 2017-03-30 MED ORDER — METOCLOPRAMIDE HCL 5 MG/ML IJ SOLN
10.0000 mg | Freq: Once | INTRAMUSCULAR | Status: AC
  Administered 2017-03-30: 10 mg via INTRAVENOUS
  Filled 2017-03-30: qty 2

## 2017-03-30 MED FILL — PROMETHAZINE 25 MG TABLET: 25 | 5 days supply | Qty: 20 | Fill #0

## 2017-03-30 MED FILL — DICYCLOMINE 20 MG TABLET: 20 | 10 days supply | Qty: 40 | Fill #0

## 2017-03-30 MED FILL — raNITIdine HCL 150 MG TABS: 150 | 7 days supply | Qty: 14 | Fill #0

## 2017-03-30 NOTE — ED Notes (Signed)
ED Provider at bedside. 

## 2017-03-30 NOTE — ED Provider Notes (Signed)
[facility] Note   CSN: [id number] Arrival date & time: 03/30/17  0815     History   Chief Complaint Chief Complaint  Patient presents with  . Abdominal Pain    HPI [name] is a 36 y.o. female.  HPI   36 year old female with history of IBS who with diffuse abdominal pain. Patient states that over the last week, she has had progressively worsening constipation. This is not abnormal for her, so she has not been evaluated. However, at approximately 1:00 this morning, she developed acute onset of severe abdominal pain, distention, and nausea with vomiting. She has been unable to eat or drink since the onset of her symptoms. The pain is an aching, cramping sensation that is worse with movement as well as attempted eating. She also reports foul-smelling burping and belching since the onset of her pain. She has never had similar symptoms. She has also had profuse, watery diarrhea with occasional blood tinged diarrhea. She does have known internal hemorrhoids with history of similar symptom. Denies any relieving factors.  Past Medical History:  Diagnosis Date  . Abnormal Pap smear   . Anxiety   . Asthma   . Depression   . Fibromyalgia   . Fibromyalgia muscle pain   . GERD (gastroesophageal reflux disease)   . Headache(784.0)    migraines  . Hypertension    no meds currently  . IBS (irritable bowel syndrome)   . Interstitial cystitis   . Mental disorder   . Normal pregnancy 05/16/2012  . PCOD (polycystic ovarian disease)   . PCOS (polycystic ovarian syndrome)   . Pre-diabetes   . SVD (spontaneous vaginal delivery) 05/17/2012    There are no active problems to display for this patient.   Past Surgical History:  Procedure Laterality Date  . APPENDECTOMY    . BREAST REDUCTION SURGERY    . CHOLECYSTECTOMY    . KNEE SURGERY      OB History    Gravida Para Term Preterm AB Living   3 2 2  0 1 2   SAB TAB Ectopic Multiple Live Births   1 0 0 0 2        Home Medications    Prior to Admission medications   Medication Sig Start Date End Date Taking? Authorizing Provider  albuterol (PROVENTIL HFA;VENTOLIN HFA) 108 (90 BASE) MCG/ACT inhaler Inhale 2 puffs into the lungs every 6 (six) hours as needed. For shortness of breath    Yes [provider]  ALPRAZolam (XANAX) 1 MG tablet Take 1 mg by mouth 2 (two) times daily.   Yes [provider]  cetirizine (ZYRTEC) 10 MG tablet Take 10 mg by mouth daily.   Yes [provider]  cyclobenzaprine (FLEXERIL) 10 MG tablet Take 1 tablet (10 mg total) by mouth 2 (two) times daily as needed for muscle spasms. 08/22/16  Yes Cheri Fowler, PA-C  metFORMIN (GLUCOPHAGE) 500 MG tablet Take 500 mg by mouth 2 (two) times daily with a meal.   Yes [provider]  METOPROLOL TARTRATE PO Take by mouth.   Yes [provider]  naproxen (NAPROSYN) 500 MG tablet Take 1 tablet (500 mg total) by mouth 2 (two) times daily. 10/04/15  Yes Renne Crigler, PA-C  nitroGLYCERIN (NITROSTAT) 0.3 MG SL tablet Place 0.3 mg under the tongue every 5 (five) minutes as needed for chest pain.   Yes [provider]  nortriptyline (PAMELOR) 10 MG capsule Take 10 mg by mouth at bedtime.  Yes [provider]  nystatin (MYCOSTATIN) powder Apply 1 Bottle topically 4 (four) times daily.   Yes [provider]  pantoprazole (PROTONIX) 40 MG tablet Take 40 mg by mouth 2 (two) times daily.   Yes [provider]  TIZANIDINE HCL PO Take by mouth.   Yes [provider]  Vitamin D, Ergocalciferol, (DRISDOL) 50000 UNITS CAPS capsule Take 50,000 Units by mouth every 7 (seven) days.   Yes [provider]  ALPRAZolam (XANAX) 0.25 MG tablet Take 0.5 mg by mouth at bedtime.     [provider]  dicyclomine (BENTYL) 20 MG tablet Take 1 tablet (20 mg total) by mouth 4 (four) times daily -  before meals and at bedtime. 03/30/17 04/09/17  Shaune Pollack, MD   gabapentin (NEURONTIN) 300 MG capsule Take 300 mg by mouth at bedtime.    [provider]  ibuprofen (ADVIL,MOTRIN) 800 MG tablet Take 1 tablet (800 mg total) by mouth 3 (three) times daily. 08/22/16   Cheri Fowler, PA-C  LISINOPRIL PO Take 2.5 mg by mouth.     [provider]  promethazine (PHENERGAN) 25 MG tablet Take 1 tablet (25 mg total) by mouth every 6 (six) hours as needed for nausea or vomiting. 03/30/17   Shaune Pollack, MD  ranitidine (ZANTAC) 150 MG tablet Take 1 tablet (150 mg total) by mouth 2 (two) times daily. 03/30/17 04/06/17  Shaune Pollack, MD    Family History Family History  Problem Relation Age of Onset  . Hypertension Mother   . Asthma Mother   . Depression Mother   . Hypertension Maternal Grandmother   . Asthma Maternal Grandmother   . Diabetes Maternal Grandmother   . Heart attack Maternal Grandfather   . Heart disease Maternal Grandfather     Social History Social History  Substance Use Topics  . Smoking status: Former Smoker    Quit date: 11/09/2008  . Smokeless tobacco: Never Used  . Alcohol use No     Allergies   Sulfa antibiotics   Review of Systems Review of Systems  Constitutional: Positive for fatigue. Negative for chills and fever.  HENT: Negative for congestion, rhinorrhea and sore throat.   Eyes: Negative for visual disturbance.  Respiratory: Negative for cough, shortness of breath and wheezing.   Cardiovascular: Negative for chest pain and leg swelling.  Gastrointestinal: Positive for abdominal distention, abdominal pain, blood in stool, diarrhea, nausea and vomiting.  Genitourinary: Negative for dysuria, flank pain, vaginal bleeding and vaginal discharge.  Musculoskeletal: Negative for neck pain.  Skin: Negative for rash.  Allergic/Immunologic: Negative for immunocompromised state.  Neurological: Negative for syncope and headaches.  Hematological: Does not bruise/bleed easily.  All other systems reviewed and are  negative.    Physical Exam Updated Vital Signs BP (!) 130/59 (BP Location: Right Arm)   Pulse 70   Temp 98.9 F (37.2 C) (Oral)   Resp 13   Ht 5\' 4"  (1.626 m)   Wt 122 kg (269 lb)   LMP 03/04/2017 (Exact Date)   SpO2 96%   BMI 46.17 kg/m   Physical Exam  Constitutional: She is oriented to person, place, and time. She appears well-developed and well-nourished. No distress.  HENT:  Head: Normocephalic and atraumatic.  Eyes: Conjunctivae are normal.  Neck: Neck supple.  Cardiovascular: Normal rate, regular rhythm and normal heart sounds.  Exam reveals no friction rub.   No murmur heard. Pulmonary/Chest: Effort normal and breath sounds normal. No respiratory distress. She has no wheezes. She  has no rales.  Abdominal: Soft. Normal appearance. She exhibits distension. There is generalized tenderness. There is no rebound.  Genitourinary:  Genitourinary Comments: Soft brown stool in rectal vault. Multiple palpable internal hemorrhoids, no prolapse. No bleeding.  Musculoskeletal: She exhibits no edema.  Neurological: She is alert and oriented to person, place, and time. She exhibits normal muscle tone.  Skin: Skin is warm. Capillary refill takes less than 2 seconds.  Psychiatric: She has a normal mood and affect.  Nursing note and vitals reviewed.    ED Treatments / Results  Labs (all labs ordered are listed, but only abnormal results are displayed) Labs Reviewed  URINALYSIS, ROUTINE W REFLEX MICROSCOPIC - Abnormal; Notable for the following:       Result Value   APPearance CLOUDY (*)    Leukocytes, UA SMALL (*)    All other components within normal limits  CBC WITH DIFFERENTIAL/PLATELET - Abnormal; Notable for the following:    WBC 11.6 (*)    All other components within normal limits  COMPREHENSIVE METABOLIC PANEL - Abnormal; Notable for the following:    Glucose, Bld 130 (*)    All other components within normal limits  URINALYSIS, MICROSCOPIC (REFLEX) - Abnormal;  Notable for the following:    Bacteria, UA MANY (*)    Squamous Epithelial / LPF 0-5 (*)    All other components within normal limits  OCCULT BLOOD X 1 CARD TO LAB, STOOL - Abnormal; Notable for the following:    Fecal Occult Bld POSITIVE (*)    All other components within normal limits  PREGNANCY, URINE  LIPASE, BLOOD  POC OCCULT BLOOD, ED    EKG  EKG Interpretation None       Radiology Ct Abdomen Pelvis W Contrast  Result Date: 03/30/2017 CLINICAL DATA:  Upper abdominal pain.  Nausea and diarrhea. EXAM: CT ABDOMEN AND PELVIS WITH CONTRAST TECHNIQUE: Multidetector CT imaging of the abdomen and pelvis was performed using the standard protocol following bolus administration of intravenous contrast. CONTRAST:  100mL ISOVUE-300 IOPAMIDOL (ISOVUE-300) INJECTION 61% COMPARISON:  CT scan dated 06/05/2008 FINDINGS: Lower chest: Normal. Hepatobiliary: Hepatic steatosis. Status post cholecystectomy. No biliary tree dilatation. Pancreas: Unremarkable. No pancreatic ductal dilatation or surrounding inflammatory changes. Spleen: Normal in size without focal abnormality. Adrenals/Urinary Tract: Adrenal glands are unremarkable. Kidneys are normal, without renal calculi, focal lesion, or hydronephrosis. Bladder is unremarkable. Stomach/Bowel: Stomach is within normal limits. Appendix has been removed. No evidence of bowel wall thickening, distention, or inflammatory changes. Vascular/Lymphatic: No significant vascular findings are present. No enlarged abdominal or pelvic lymph nodes. Reproductive: Uterus and bilateral adnexa are unremarkable. Other: No abdominal wall hernia or abnormality. No abdominopelvic ascites. Musculoskeletal: No acute or significant osseous findings. IMPRESSION: 1. No acute abnormalities. 2. Hepatic steatosis. Electronically Signed   By: Francene BoyersJames  Maxwell M.D.   On: 03/30/2017 10:23    Procedures Procedures (including critical care time)  Medications Ordered in ED Medications    sodium chloride 0.9 % bolus 1,000 mL (0 mLs Intravenous Stopped 03/30/17 1025)  HYDROmorphone (DILAUDID) injection 1 mg (1 mg Intravenous Given 03/30/17 0917)  ondansetron (ZOFRAN) injection 4 mg (4 mg Intravenous Given 03/30/17 0911)  iopamidol (ISOVUE-300) 61 % injection 100 mL (100 mLs Intravenous Contrast Given 03/30/17 0959)  gi cocktail (Maalox,Lidocaine,Donnatal) (30 mLs Oral Given 03/30/17 1044)  metoCLOPramide (REGLAN) injection 10 mg (10 mg Intravenous Given 03/30/17 1043)  diphenhydrAMINE (BENADRYL) injection 25 mg (25 mg Intravenous Given 03/30/17 1041)     Initial Impression / Assessment and  Plan / ED Course  I have reviewed the triage vital signs and the nursing notes.  Pertinent labs & imaging results that were available during my care of the patient were reviewed by me and considered in my medical decision making (see chart for details).    36 yo F with PMHx as above including IBS here with nausea, belching, abdominal pain, and diarrhea. Suspect IBS versus viral GI illness versus food-borne illness. Her labs show mild leukocytosis but are o/w reassuring with normal LFTs, normal renal function. She does have +hemoccult but has known internal hemorrhoids and no evidence of prolapse or bleeding on rectal exam. CT scan is negative. UA without UTI. No vaginal bleeding, discharge, or s/s to suggest PID or GU etiology. Will treat supportively, d/c with outpt follow-up.  Final Clinical Impressions(s) / ED Diagnoses   Final diagnoses:  Generalized abdominal pain  Internal hemorrhoids    New Prescriptions Discharge Medication List as of 03/30/2017 12:37 PM    START taking these medications   Details  promethazine (PHENERGAN) 25 MG tablet Take 1 tablet (25 mg total) by mouth every 6 (six) hours as needed for nausea or vomiting., Starting Tue 03/30/2017, Print    ranitidine (ZANTAC) 150 MG tablet Take 1 tablet (150 mg total) by mouth 2 (two) times daily., Starting Tue 03/30/2017, Until  Tue 04/06/2017, Print         Shaune Pollack, MD 03/31/17 [phone]

## 2017-03-30 NOTE — ED Triage Notes (Signed)
Pt reports this past Sunday she started having blood in her stool (reports hx of hemorrhoids and IBS). Reports waking up around 0130 with abd pain, foul-tasting burps, and diarrhea. Denies fever, vomiting, recent course of antibiotics. Reports nausea.

## 2017-04-03 ENCOUNTER — Emergency Department (HOSPITAL_BASED_OUTPATIENT_CLINIC_OR_DEPARTMENT_OTHER)
Admission: EM | Admit: 2017-04-03 | Discharge: 2017-04-03 | Disposition: A | Discharge status: Home / Self Care | Payer: BLUE CROSS/BLUE SHIELD | Attending: Emergency Medicine | Admitting: Emergency Medicine

## 2017-04-03 ENCOUNTER — Encounter (HOSPITAL_BASED_OUTPATIENT_CLINIC_OR_DEPARTMENT_OTHER): Payer: Self-pay | Admitting: Emergency Medicine

## 2017-04-03 DIAGNOSIS — I10 Essential (primary) hypertension: Secondary | ICD-10-CM | POA: Diagnosis not present

## 2017-04-03 DIAGNOSIS — H9202 Otalgia, left ear: Secondary | ICD-10-CM | POA: Diagnosis present

## 2017-04-03 DIAGNOSIS — R7303 Prediabetes: Secondary | ICD-10-CM | POA: Diagnosis not present

## 2017-04-03 DIAGNOSIS — Z87891 Personal history of nicotine dependence: Secondary | ICD-10-CM | POA: Insufficient documentation

## 2017-04-03 DIAGNOSIS — Z79899 Other long term (current) drug therapy: Secondary | ICD-10-CM | POA: Insufficient documentation

## 2017-04-03 DIAGNOSIS — Z7984 Long term (current) use of oral hypoglycemic drugs: Secondary | ICD-10-CM | POA: Diagnosis not present

## 2017-04-03 DIAGNOSIS — H65192 Other acute nonsuppurative otitis media, left ear: Secondary | ICD-10-CM | POA: Insufficient documentation

## 2017-04-03 DIAGNOSIS — J45909 Unspecified asthma, uncomplicated: Secondary | ICD-10-CM | POA: Insufficient documentation

## 2017-04-03 MED ORDER — OXYCODONE-ACETAMINOPHEN 5-325 MG PO TABS
1.0000 | ORAL_TABLET | Freq: Once | ORAL | Status: AC
  Administered 2017-04-03: 1 via ORAL
  Filled 2017-04-03: qty 1

## 2017-04-03 MED ORDER — ACETAMINOPHEN 325 MG PO TABS
650.0000 mg | ORAL_TABLET | Freq: Once | ORAL | Status: AC
  Administered 2017-04-03: 650 mg via ORAL
  Filled 2017-04-03: qty 2

## 2017-04-03 MED ORDER — AMOXICILLIN 500 MG PO CAPS: 500.0000 mg | ORAL_CAPSULE | Freq: Three times a day (TID) | ORAL | 0 refills | Status: DC

## 2017-04-03 MED ORDER — ONDANSETRON 8 MG PO TBDP
8.0000 mg | ORAL_TABLET | Freq: Once | ORAL | Status: AC
  Administered 2017-04-03: 8 mg via ORAL
  Filled 2017-04-03: qty 1

## 2017-04-03 NOTE — ED Triage Notes (Signed)
L ear pain since this morning and sore throat.

## 2017-04-03 NOTE — ED Notes (Addendum)
EDPA into room, prior to RN assessment, see PA notes, orders received and initiated.   Alert, NAD, restless, rocking back and forth, interactive, resps e/u, speaking in clear complete sentences, no dyspnea noted, skin W&D, c/o L ear ache, nausea and dizziness, also mentions recent drainage, onset this am, (denies: fever, vomiting, sob, or visual changes).

## 2017-04-03 NOTE — ED Provider Notes (Signed)
[facility]   CSN: [id number] Arrival date & time: 04/03/17  1818  By signing my name below, I, Thelma Barge, attest that this documentation has been prepared under the direction and in the presence of Yerlin Gasparyan PA-C. Electronically Signed: Thelma Barge, Scribe. 04/03/17. 6:43 PM.  History   Chief Complaint Chief Complaint  Patient presents with  . Otalgia   The history is provided by the patient. No language interpreter was used.    HPI Comments: [name] is a 36 y.o. female who presents to the Emergency Department complaining of constant, gradual onset severe left ear pain since this morning. She states the pain radiates down to her left-sided face and mouth. She has associated sore throat, pain with swallowing, nausea. She denies fever, discharge, cough, trouble breathing, difficulty walking, numbness in legs. She denies taking any medications for the pain or any illnesses at home.   Past Medical History:  Diagnosis Date  . Abnormal Pap smear   . Anxiety   . Asthma   . Depression   . Fibromyalgia   . Fibromyalgia muscle pain   . GERD (gastroesophageal reflux disease)   . Headache(784.0)    migraines  . Hypertension    no meds currently  . IBS (irritable bowel syndrome)   . Interstitial cystitis   . Mental disorder   . Normal pregnancy 05/16/2012  . PCOD (polycystic ovarian disease)   . PCOS (polycystic ovarian syndrome)   . Pre-diabetes   . SVD (spontaneous vaginal delivery) 05/17/2012    There are no active problems to display for this patient.   Past Surgical History:  Procedure Laterality Date  . APPENDECTOMY    . BREAST REDUCTION SURGERY    . CHOLECYSTECTOMY    . KNEE SURGERY      OB History    Gravida Para Term Preterm AB Living   3 2 2  0 1 2   SAB TAB Ectopic Multiple Live Births   1 0 0 0 2       Home Medications    Prior to Admission medications   Medication Sig Start Date End Date Taking? Authorizing Provider    albuterol (PROVENTIL HFA;VENTOLIN HFA) 108 (90 BASE) MCG/ACT inhaler Inhale 2 puffs into the lungs every 6 (six) hours as needed. For shortness of breath    Yes [provider]  ALPRAZolam (XANAX) 0.25 MG tablet Take 0.5 mg by mouth at bedtime.    Yes [provider]  ALPRAZolam Prudy Feeler) 1 MG tablet Take 1 mg by mouth 2 (two) times daily.   Yes [provider]  gabapentin (NEURONTIN) 300 MG capsule Take 300 mg by mouth at bedtime.   Yes [provider]  metFORMIN (GLUCOPHAGE) 500 MG tablet Take 500 mg by mouth 2 (two) times daily with a meal.   Yes [provider]  METOPROLOL TARTRATE PO Take by mouth.   Yes [provider]  nitroGLYCERIN (NITROSTAT) 0.3 MG SL tablet Place 0.3 mg under the tongue every 5 (five) minutes as needed for chest pain.   Yes [provider]  nortriptyline (PAMELOR) 10 MG capsule Take 10 mg by mouth at bedtime.   Yes [provider]  pantoprazole (PROTONIX) 40 MG tablet Take 40 mg by mouth 2 (two) times daily.   Yes [provider]  amoxicillin (AMOXIL) 500 MG capsule Take 1 capsule (500 mg total) by mouth 3 (three) times daily. 04/03/17   Jairon Ripberger, PA-C  cetirizine (ZYRTEC) 10 MG tablet  Take 10 mg by mouth daily.    [provider]  cyclobenzaprine (FLEXERIL) 10 MG tablet Take 1 tablet (10 mg total) by mouth 2 (two) times daily as needed for muscle spasms. 08/22/16   Cheri Fowler, PA-C  dicyclomine (BENTYL) 20 MG tablet Take 1 tablet (20 mg total) by mouth 4 (four) times daily -  before meals and at bedtime. 03/30/17 04/09/17  Shaune Pollack, MD  ibuprofen (ADVIL,MOTRIN) 800 MG tablet Take 1 tablet (800 mg total) by mouth 3 (three) times daily. 08/22/16   Cheri Fowler, PA-C  LISINOPRIL PO Take 2.5 mg by mouth.     [provider]  naproxen (NAPROSYN) 500 MG tablet Take 1 tablet (500 mg total) by mouth 2 (two) times daily. 10/04/15   Renne Crigler, PA-C  nystatin (MYCOSTATIN)  powder Apply 1 Bottle topically 4 (four) times daily.    [provider]  promethazine (PHENERGAN) 25 MG tablet Take 1 tablet (25 mg total) by mouth every 6 (six) hours as needed for nausea or vomiting. 03/30/17   Shaune Pollack, MD  ranitidine (ZANTAC) 150 MG tablet Take 1 tablet (150 mg total) by mouth 2 (two) times daily. 03/30/17 04/06/17  Shaune Pollack, MD  TIZANIDINE HCL PO Take by mouth.    [provider]  Vitamin D, Ergocalciferol, (DRISDOL) 50000 UNITS CAPS capsule Take 50,000 Units by mouth every 7 (seven) days.    [provider]    Family History Family History  Problem Relation Age of Onset  . Hypertension Mother   . Asthma Mother   . Depression Mother   . Hypertension Maternal Grandmother   . Asthma Maternal Grandmother   . Diabetes Maternal Grandmother   . Heart attack Maternal Grandfather   . Heart disease Maternal Grandfather     Social History Social History  Substance Use Topics  . Smoking status: Former Smoker    Quit date: 11/09/2008  . Smokeless tobacco: Never Used  . Alcohol use No     Allergies   Sulfa antibiotics   Review of Systems Review of Systems  Constitutional: Negative for fever.  HENT: Positive for ear pain, facial swelling and sore throat. Negative for ear discharge and trouble swallowing.   Eyes: Negative for photophobia.  Respiratory: Negative for cough.   Gastrointestinal: Positive for nausea. Negative for vomiting.  Neurological: Positive for headaches. Numbness: left-sided face.       No numbness in legs     Physical Exam Updated Vital Signs BP (!) 129/95 (BP Location: Right Arm)   Pulse (!) 107   Temp 98.5 F (36.9 C) (Oral)   Resp 20   LMP 03/04/2017 (Exact Date)   SpO2 96%   Physical Exam  Constitutional: She appears well-developed and well-nourished. No distress.  HENT:  Head: Normocephalic and atraumatic.  Right Ear: No mastoid tenderness. Tympanic membrane is erythematous. Tympanic  membrane is not perforated and not retracted. A middle ear effusion is present.  Left Ear: No mastoid tenderness. Tympanic membrane is erythematous and retracted. Tympanic membrane is not perforated. A middle ear effusion is present.  Mouth/Throat: Uvula is midline and mucous membranes are normal. Posterior oropharyngeal erythema present. No tonsillar exudate.  Tolerating secretions.  Eyes: Conjunctivae and EOM are normal. No scleral icterus.  Neck: Normal range of motion. Neck supple.  Pulmonary/Chest: Effort normal. No respiratory distress.  Lymphadenopathy:    She has no cervical adenopathy.  Neurological: She is alert.  Skin: No rash noted. She is not diaphoretic.  Psychiatric: She  has a normal mood and affect.  Nursing note and vitals reviewed.    ED Treatments / Results  DIAGNOSTIC STUDIES: Oxygen Saturation is 100% on RA, normal by my interpretation.    COORDINATION OF CARE: 6:39 PM Discussed treatment plan with pt at bedside and pt agreed to plan. Labs (all labs ordered are listed, but only abnormal results are displayed) Labs Reviewed - No data to display  EKG  EKG Interpretation None       Radiology No results found.  Procedures Procedures (including critical care time)  Medications Ordered in ED Medications  oxyCODONE-acetaminophen (PERCOCET/ROXICET) 5-325 MG per tablet 1 tablet (not administered)  acetaminophen (TYLENOL) tablet 650 mg (650 mg Oral Given 04/03/17 1914)  ondansetron (ZOFRAN-ODT) disintegrating tablet 8 mg (8 mg Oral Given 04/03/17 1948)     Initial Impression / Assessment and Plan / ED Course  I have reviewed the triage vital signs and the nursing notes.  Pertinent labs & imaging results that were available during my care of the patient were reviewed by me and considered in my medical decision making (see chart for details).     Patient's history and symptoms concerning for otitis media. No signs of extended infection to deeper tissues.  There are clear signs of otitis media present in the left ear. Patient's heart rate improved here in the ED after pain control and Zofran. Patient is afebrile with no history of fever. Full range of motion of the neck. Patient is tolerating secretions and is not in respiratory distress. We'll treat with amoxicillin and advised her to continue Tylenol as needed for pain. Follow-up with PCP for further evaluation. Strict return precautions given.  Final Clinical Impressions(s) / ED Diagnoses   Final diagnoses:  Other acute nonsuppurative otitis media of left ear, recurrence not specified    New Prescriptions New Prescriptions   AMOXICILLIN (AMOXIL) 500 MG CAPSULE    Take 1 capsule (500 mg total) by mouth 3 (three) times daily.   I personally performed the services described in this documentation, which was scribed in my presence. The recorded information has been reviewed and is accurate.     Dietrich PatesKhatri, Stephnie Parlier, PA-C 04/03/17 2007    Gwyneth SproutPlunkett, Whitney, MD 04/03/17 [phone]

## 2017-04-03 NOTE — Discharge Instructions (Signed)
Take amoxicillin 3 times daily as directed. Continue Tylenol as needed for pain. Schedule follow-up appointment with PCP for further evaluation. Return to ED for worsening pain, trouble hearing, trouble swallowing, trouble breathing, high fevers, no improvement in symptoms.

## 2017-04-03 NOTE — ED Notes (Signed)
PA at BS.  

## 2017-04-04 ENCOUNTER — Emergency Department (HOSPITAL_BASED_OUTPATIENT_CLINIC_OR_DEPARTMENT_OTHER)
Admission: EM | Admit: 2017-04-04 | Discharge: 2017-04-04 | Disposition: A | Discharge status: Home / Self Care | Payer: BLUE CROSS/BLUE SHIELD | Attending: Physician Assistant | Admitting: Physician Assistant

## 2017-04-04 ENCOUNTER — Encounter (HOSPITAL_BASED_OUTPATIENT_CLINIC_OR_DEPARTMENT_OTHER): Payer: Self-pay | Admitting: *Deleted

## 2017-04-04 DIAGNOSIS — R7303 Prediabetes: Secondary | ICD-10-CM | POA: Diagnosis not present

## 2017-04-04 DIAGNOSIS — Z87891 Personal history of nicotine dependence: Secondary | ICD-10-CM | POA: Diagnosis not present

## 2017-04-04 DIAGNOSIS — I10 Essential (primary) hypertension: Secondary | ICD-10-CM | POA: Diagnosis not present

## 2017-04-04 DIAGNOSIS — J45909 Unspecified asthma, uncomplicated: Secondary | ICD-10-CM | POA: Insufficient documentation

## 2017-04-04 DIAGNOSIS — H9210 Otorrhea, unspecified ear: Secondary | ICD-10-CM | POA: Diagnosis present

## 2017-04-04 DIAGNOSIS — H7292 Unspecified perforation of tympanic membrane, left ear: Secondary | ICD-10-CM | POA: Diagnosis not present

## 2017-04-04 DIAGNOSIS — Z79899 Other long term (current) drug therapy: Secondary | ICD-10-CM | POA: Diagnosis not present

## 2017-04-04 MED ORDER — ONDANSETRON 4 MG PO TBDP: 4.0000 mg | ORAL_TABLET | Freq: Three times a day (TID) | ORAL | 0 refills | Status: DC | PRN

## 2017-04-04 MED ORDER — ONDANSETRON 4 MG PO TBDP
4.0000 mg | ORAL_TABLET | Freq: Once | ORAL | Status: AC
  Administered 2017-04-04: 4 mg via ORAL
  Filled 2017-04-04: qty 1

## 2017-04-04 MED ORDER — CIPROFLOXACIN-DEXAMETHASONE 0.3-0.1 % OT SUSP
4.0000 [drp] | Freq: Once | OTIC | Status: AC
  Administered 2017-04-04: 4 [drp] via OTIC
  Filled 2017-04-04: qty 7.5

## 2017-04-04 NOTE — ED Notes (Addendum)
Alert, NAD, calm, interactive, resps e/u, speaking in clear complete sentences, no dyspnea noted, skin W&D, VSS. Family at BS. 

## 2017-04-04 NOTE — ED Triage Notes (Signed)
Seen here yesterday for the same. "Feels better now". Reports pain decreased.  Returns for drainage change, "was clear, now pus and blood". Associated with L ear pain, pus/bloody drainage, dizziness, nausea, ringing, popping, decreased hearing out of L ear. She spoke to an nurse/insurance phone service and was instructed to come in. She started her antibiotic last night at 2245. Last tylenol at 0230. (denies: vomiting, fever, sinus pressure, congestion,  or diarrhea). Rates pain 3/10. Also mentions used inhaler yesterday for cough.

## 2017-04-04 NOTE — ED Provider Notes (Signed)
[facility] Provider Note   CSN: [id number] Arrival date & time: 04/04/17  [id number]     History   Chief Complaint Chief Complaint  Patient presents with  . Ear Drainage    HPI [name] is a 36 y.o. female.  HPI   36 yo here with ear drainage. Seen 12 hours ago, diagnosed with OM. Went home, ear drainage went from clear to pus. With relief of pain. She called nurse hotline and was told to come here.   Past Medical History:  Diagnosis Date  . Abnormal Pap smear   . Anxiety   . Asthma   . Depression   . Fibromyalgia   . Fibromyalgia muscle pain   . GERD (gastroesophageal reflux disease)   . Headache(784.0)    migraines  . Hypertension    no meds currently  . IBS (irritable bowel syndrome)   . Interstitial cystitis   . Mental disorder   . Normal pregnancy 05/16/2012  . PCOD (polycystic ovarian disease)   . PCOS (polycystic ovarian syndrome)   . Pre-diabetes   . SVD (spontaneous vaginal delivery) 05/17/2012    There are no active problems to display for this patient.   Past Surgical History:  Procedure Laterality Date  . APPENDECTOMY    . BREAST REDUCTION SURGERY    . CHOLECYSTECTOMY    . KNEE SURGERY      OB History    Gravida Para Term Preterm AB Living   3 2 2  0 1 2   SAB TAB Ectopic Multiple Live Births   1 0 0 0 2       Home Medications    Prior to Admission medications   Medication Sig Start Date End Date Taking? Authorizing Provider  albuterol (PROVENTIL HFA;VENTOLIN HFA) 108 (90 BASE) MCG/ACT inhaler Inhale 2 puffs into the lungs every 6 (six) hours as needed. For shortness of breath     [provider]  ALPRAZolam (XANAX) 0.25 MG tablet Take 0.5 mg by mouth at bedtime.     [provider]  ALPRAZolam Prudy Feeler(XANAX) 1 MG tablet Take 1 mg by mouth 2 (two) times daily.    [provider]  amoxicillin (AMOXIL) 500 MG capsule Take 1 capsule (500 mg total) by mouth 3 (three) times daily. 04/03/17   Khatri, Hina, PA-C    cetirizine (ZYRTEC) 10 MG tablet Take 10 mg by mouth daily.    [provider]  cyclobenzaprine (FLEXERIL) 10 MG tablet Take 1 tablet (10 mg total) by mouth 2 (two) times daily as needed for muscle spasms. 08/22/16   Cheri Fowlerose, Kayla, PA-C  dicyclomine (BENTYL) 20 MG tablet Take 1 tablet (20 mg total) by mouth 4 (four) times daily -  before meals and at bedtime. 03/30/17 04/09/17  Shaune PollackIsaacs, Cameron, MD  gabapentin (NEURONTIN) 300 MG capsule Take 300 mg by mouth at bedtime.    [provider]  ibuprofen (ADVIL,MOTRIN) 800 MG tablet Take 1 tablet (800 mg total) by mouth 3 (three) times daily. 08/22/16   Cheri Fowlerose, Kayla, PA-C  LISINOPRIL PO Take 2.5 mg by mouth.     [provider]  metFORMIN (GLUCOPHAGE) 500 MG tablet Take 500 mg by mouth 2 (two) times daily with a meal.    [provider]  METOPROLOL TARTRATE PO Take by mouth.    [provider]  naproxen (NAPROSYN) 500 MG tablet Take 1 tablet (500 mg total) by mouth 2 (two) times daily. 10/04/15   Renne CriglerGeiple, Joshua, PA-C  nitroGLYCERIN (NITROSTAT)  0.3 MG SL tablet Place 0.3 mg under the tongue every 5 (five) minutes as needed for chest pain.    [provider]  nortriptyline (PAMELOR) 10 MG capsule Take 10 mg by mouth at bedtime.    [provider]  nystatin (MYCOSTATIN) powder Apply 1 Bottle topically 4 (four) times daily.    [provider]  pantoprazole (PROTONIX) 40 MG tablet Take 40 mg by mouth 2 (two) times daily.    [provider]  promethazine (PHENERGAN) 25 MG tablet Take 1 tablet (25 mg total) by mouth every 6 (six) hours as needed for nausea or vomiting. 03/30/17   Shaune Pollack, MD  ranitidine (ZANTAC) 150 MG tablet Take 1 tablet (150 mg total) by mouth 2 (two) times daily. 03/30/17 04/06/17  Shaune Pollack, MD  TIZANIDINE HCL PO Take by mouth.    [provider]  Vitamin D, Ergocalciferol, (DRISDOL) 50000 UNITS CAPS capsule Take 50,000 Units by mouth every 7  (seven) days.    [provider]    Family History Family History  Problem Relation Age of Onset  . Hypertension Mother   . Asthma Mother   . Depression Mother   . Hypertension Maternal Grandmother   . Asthma Maternal Grandmother   . Diabetes Maternal Grandmother   . Heart attack Maternal Grandfather   . Heart disease Maternal Grandfather     Social History Social History  Substance Use Topics  . Smoking status: Former Smoker    Quit date: 11/09/2008  . Smokeless tobacco: Never Used  . Alcohol use No     Allergies   Sulfa antibiotics   Review of Systems Review of Systems  Constitutional: Positive for fatigue. Negative for fever.  HENT: Positive for ear pain.   Neurological: Positive for dizziness and light-headedness.     Physical Exam Updated Vital Signs BP (!) 144/92 (BP Location: Right Arm)   Pulse 98   Resp (!) 103   SpO2 97%   Physical Exam  Constitutional: She is oriented to person, place, and time. She appears well-developed and well-nourished.  HENT:  Head: Normocephalic and atraumatic.  L tm air bubbles draining, R TM normal  Eyes: Right eye exhibits no discharge. Left eye exhibits no discharge.  Cardiovascular: Normal rate.   Pulmonary/Chest: Effort normal.  Neurological: She is oriented to person, place, and time.  Skin: Skin is warm and dry. She is not diaphoretic.  Psychiatric: She has a normal mood and affect.  Nursing note and vitals reviewed.    ED Treatments / Results  Labs (all labs ordered are listed, but only abnormal results are displayed) Labs Reviewed - No data to display  EKG  EKG Interpretation None       Radiology No results found.  Procedures Procedures (including critical care time)  Medications Ordered in ED Medications  ciprofloxacin-dexamethasone (CIPRODEX) 0.3-0.1 % otic suspension 4 drop (not administered)  ondansetron (ZOFRAN-ODT) disintegrating tablet 4 mg (not administered)     Initial  Impression / Assessment and Plan / ED Course  I have reviewed the triage vital signs and the nursing notes.  Pertinent labs & imaging results that were available during my care of the patient were reviewed by me and considered in my medical decision making (see chart for details).    Well appearing 37 yo diagnosed with OM 12 hours ago now with symptoms of ruptured TM. Pain is better, minor drainage. Do not suspect deeper infection. Will give ciprodex drops in addition to her oral abx.  Instructions for no water.   Final Clinical Impressions(s) / ED Diagnoses   Final diagnoses:  None    New Prescriptions New Prescriptions   No medications on file     Abelino Derrick, MD 04/04/17 [phone]

## 2017-04-04 NOTE — Discharge Instructions (Signed)
Use drops twice daily, stay away from water submersion for 2-3 weeks. You can follow upw with ENT. Continue oral antibitoics.

## 2017-04-04 NOTE — ED Notes (Signed)
Encouraged to force fluids, water at bedside

## 2017-05-21 ENCOUNTER — Other Ambulatory Visit: Payer: Self-pay | Admitting: Otolaryngology

## 2017-05-27 ENCOUNTER — Emergency Department (HOSPITAL_COMMUNITY)
Admission: EM | Admit: 2017-05-27 | Discharge: 2017-05-27 | Disposition: A | Discharge status: Home / Self Care | Payer: BLUE CROSS/BLUE SHIELD | Attending: Emergency Medicine | Admitting: Emergency Medicine

## 2017-05-27 ENCOUNTER — Emergency Department (HOSPITAL_COMMUNITY): Payer: BLUE CROSS/BLUE SHIELD

## 2017-05-27 ENCOUNTER — Encounter (HOSPITAL_COMMUNITY): Payer: Self-pay | Admitting: Emergency Medicine

## 2017-05-27 DIAGNOSIS — Z87891 Personal history of nicotine dependence: Secondary | ICD-10-CM | POA: Insufficient documentation

## 2017-05-27 DIAGNOSIS — J95830 Postprocedural hemorrhage and hematoma of a respiratory system organ or structure following a respiratory system procedure: Secondary | ICD-10-CM | POA: Diagnosis not present

## 2017-05-27 DIAGNOSIS — R7303 Prediabetes: Secondary | ICD-10-CM | POA: Insufficient documentation

## 2017-05-27 DIAGNOSIS — I10 Essential (primary) hypertension: Secondary | ICD-10-CM | POA: Diagnosis not present

## 2017-05-27 DIAGNOSIS — Z9089 Acquired absence of other organs: Secondary | ICD-10-CM | POA: Diagnosis not present

## 2017-05-27 DIAGNOSIS — Z79899 Other long term (current) drug therapy: Secondary | ICD-10-CM | POA: Insufficient documentation

## 2017-05-27 DIAGNOSIS — J45909 Unspecified asthma, uncomplicated: Secondary | ICD-10-CM | POA: Diagnosis not present

## 2017-05-27 DIAGNOSIS — K920 Hematemesis: Secondary | ICD-10-CM | POA: Diagnosis present

## 2017-05-27 LAB — CBC
HEMATOCRIT: 36.3 % (ref 36.0–46.0)
Hemoglobin: 12.4 g/dL (ref 12.0–15.0)
MCH: 29.1 pg (ref 26.0–34.0)
MCHC: 34.2 g/dL (ref 30.0–36.0)
MCV: 85.2 fL (ref 78.0–100.0)
PLATELETS: 293 10*3/uL (ref 150–400)
RBC: 4.26 MIL/uL (ref 3.87–5.11)
RDW: 13.8 % (ref 11.5–15.5)
WBC: 10.4 10*3/uL (ref 4.0–10.5)

## 2017-05-27 LAB — BASIC METABOLIC PANEL
ANION GAP: 9 (ref 5–15)
BUN: 6 mg/dL (ref 6–20)
CALCIUM: 9 mg/dL (ref 8.9–10.3)
CO2: 28 mmol/L (ref 22–32)
Chloride: 102 mmol/L (ref 101–111)
Creatinine, Ser: 0.71 mg/dL (ref 0.44–1.00)
GFR calc Af Amer: >60 mL/min (ref >60)
GLUCOSE: 133 mg/dL — AB (ref 65–99)
Potassium: 3.8 mmol/L (ref 3.5–5.1)
Sodium: 139 mmol/L (ref 135–145)

## 2017-05-27 MED ORDER — ONDANSETRON 4 MG PO TBDP
4.0000 mg | ORAL_TABLET | Freq: Once | ORAL | Status: AC
Start: 2017-05-27 — End: 2017-05-27
  Administered 2017-05-27: 4 mg via ORAL
  Filled 2017-05-27: qty 1

## 2017-05-27 MED ORDER — SODIUM CHLORIDE 0.9 % IV BOLUS (SEPSIS)
1000.0000 mL | Freq: Once | INTRAVENOUS | Status: AC
  Administered 2017-05-27: 1000 mL via INTRAVENOUS

## 2017-05-27 MED ORDER — HYDROCODONE-ACETAMINOPHEN 5-325 MG PO TABS
1.0000 | ORAL_TABLET | Freq: Once | ORAL | Status: AC
  Administered 2017-05-27: 1 via ORAL
  Filled 2017-05-27: qty 1

## 2017-05-27 NOTE — ED Provider Notes (Signed)
[facility]-EMER[facility]ENCY DEPT Provider Note   CSN: [id number] Arrival date & time: 05/27/17  1515     History   Chief Complaint Chief Complaint  Patient presents with  . Hematemesis    HPI [name] is a 36 y.o. female.  HPI Patient presents with vomiting of blood. Had a tonsillectomy on the 13th which today being the 19th. States she did vomit up blood couple times a day last one being around 2:30. States she has been coughing up blood and had some blood in her sputum since then. States she feels lightheadedness feels bad. She is not on anticoagulation. Had discussed with ENT and was told to come into the hospital. Past Medical History:  Diagnosis Date  . Abnormal Pap smear   . Anxiety   . Asthma   . Depression   . Fibromyalgia   . Fibromyalgia muscle pain   . GERD (gastroesophageal reflux disease)   . Headache(784.0)    migraines  . Hypertension    no meds currently  . IBS (irritable bowel syndrome)   . Interstitial cystitis   . Mental disorder   . Normal pregnancy 05/16/2012  . PCOD (polycystic ovarian disease)   . PCOS (polycystic ovarian syndrome)   . Pre-diabetes   . SVD (spontaneous vaginal delivery) 05/17/2012    There are no active problems to display for this patient.   Past Surgical History:  Procedure Laterality Date  . APPENDECTOMY    . BREAST REDUCTION SURGERY    . CHOLECYSTECTOMY    . KNEE SURGERY      OB History    Gravida Para Term Preterm AB Living   3 2 2  0 1 2   SAB TAB Ectopic Multiple Live Births   1 0 0 0 2       Home Medications    Prior to Admission medications   Medication Sig Start Date End Date Taking? Authorizing Provider  albuterol (PROVENTIL HFA;VENTOLIN HFA) 108 (90 BASE) MCG/ACT inhaler Inhale 2 puffs into the lungs every 6 (six) hours as needed. For shortness of breath    Yes [provider]  ALPRAZolam (XANAX) 1 MG tablet Take 1 mg by mouth 2 (two) times daily.   Yes [provider]  cetirizine (ZYRTEC) 10  MG tablet Take 10 mg by mouth daily.   Yes [provider]  desvenlafaxine (PRISTIQ) 100 MG 24 hr tablet Take 100 mg by mouth at bedtime. 05/05/17  Yes [provider]  dicyclomine (BENTYL) 20 MG tablet Take 1 tablet (20 mg total) by mouth 4 (four) times daily -  before meals and at bedtime. 03/30/17 05/27/17 Yes Shaune Pollack, MD  fluticasone (FLONASE) 50 MCG/ACT nasal spray Place 1 spray into both nostrils daily as needed for allergies. 01/27/16  Yes [provider]  gabapentin (NEURONTIN) 300 MG capsule Take 300 mg by mouth at bedtime.   Yes [provider]  HYDROcodone-acetaminophen (HYCET) 7.5-325 mg/15 ml solution Take 15-20 mLs by mouth every 4 (four) hours as needed for pain. 05/21/17  Yes [provider]  ibuprofen (ADVIL,MOTRIN) 100 MG/5ML suspension Take 600 mg by mouth every 4 (four) hours as needed for moderate pain.   Yes [provider]  metFORMIN (GLUCOPHAGE) 500 MG tablet Take 500 mg by mouth 2 (two) times daily with a meal.   Yes [provider]  metoprolol tartrate (LOPRESSOR) 50 MG tablet Take 75 mg by mouth daily. 04/26/17  Yes [provider]  nortriptyline (PAMELOR) 10 MG capsule Take 10  mg by mouth at bedtime.   Yes [provider]  nystatin (MYCOSTATIN) powder Apply 1 Bottle topically 4 (four) times daily.   Yes [provider]  OVER THE COUNTER MEDICATION Take 1 capsule by mouth 2 (two) times daily.   Yes [provider]  pantoprazole (PROTONIX) 40 MG tablet Take 40 mg by mouth 2 (two) times daily.   Yes [provider]  amoxicillin (AMOXIL) 500 MG capsule Take 1 capsule (500 mg total) by mouth 3 (three) times daily. Patient not taking: Reported on 05/27/2017 04/03/17   Dietrich Pates, PA-C  nitroGLYCERIN (NITROSTAT) 0.3 MG SL tablet Place 0.3 mg under the tongue every 5 (five) minutes as needed for chest pain.    [provider]  ondansetron (ZOFRAN ODT) 4 MG  disintegrating tablet Take 1 tablet (4 mg total) by mouth every 8 (eight) hours as needed for nausea or vomiting. Patient not taking: Reported on 05/27/2017 04/04/17   Mackuen, Courteney Lyn, MD  promethazine (PHENERGAN) 25 MG tablet Take 1 tablet (25 mg total) by mouth every 6 (six) hours as needed for nausea or vomiting. Patient not taking: Reported on 05/27/2017 03/30/17   Shaune Pollack, MD  ranitidine (ZANTAC) 150 MG tablet Take 1 tablet (150 mg total) by mouth 2 (two) times daily. 03/30/17 04/06/17  Shaune Pollack, MD    Family History Family History  Problem Relation Age of Onset  . Hypertension Mother   . Asthma Mother   . Depression Mother   . Hypertension Maternal Grandmother   . Asthma Maternal Grandmother   . Diabetes Maternal Grandmother   . Heart attack Maternal Grandfather   . Heart disease Maternal Grandfather     Social History Social History  Substance Use Topics  . Smoking status: Former Smoker    Quit date: 11/09/2008  . Smokeless tobacco: Never Used  . Alcohol use No     Allergies   Sulfa antibiotics   Review of Systems Review of Systems  Constitutional: Positive for appetite change. Negative for fever.  HENT: Positive for sore throat.   Respiratory: Negative for chest tightness and shortness of breath.   Cardiovascular: Positive for chest pain.  Gastrointestinal: Negative for abdominal pain.  Genitourinary: Negative for flank pain.  Musculoskeletal: Negative for back pain.  Neurological: Positive for light-headedness.     Physical Exam Updated Vital Signs BP 126/76   Pulse 82   Temp 98.2 F (36.8 C) (Oral)   Resp 16   Ht 5\' 4"  (1.626 m)   Wt 118.8 kg (262 lb)   LMP 05/09/2017   SpO2 98%   BMI 44.97 kg/m   Physical Exam  Constitutional: She appears well-developed.  HENT:  Post tonsillectomy bilaterally. Light postsurgical changes. No active bleeding seen.  Neck: Neck supple.  Cardiovascular: Normal rate.   Pulmonary/Chest: Effort  normal.  Abdominal: There is no tenderness.  Musculoskeletal: She exhibits no tenderness.  Neurological: She is alert.  Skin: Skin is warm. Capillary refill takes less than 2 seconds.     ED Treatments / Results  Labs (all labs ordered are listed, but only abnormal results are displayed) Labs Reviewed  BASIC METABOLIC PANEL - Abnormal; Notable for the following:       Result Value   Glucose, Bld 133 (*)    All other components within normal limits  CBC  I-STAT TROPONIN, ED    EKG  EKG Interpretation None       Radiology Dg Chest 2 View  Result Date: 05/27/2017 CLINICAL  DATA:  Acute onset of hematemesis today associated with mid chest pain and shortness of breath. Postop day 6 from tonsillectomy. Former smoker. Current history of asthma and hypertension. EXAM: CHEST  2 VIEW COMPARISON:  08/22/2016, 08/06/2016 and earlier. FINDINGS: Cardiomediastinal silhouette unremarkable, unchanged. Patchy opacities involving the left lower lobe. Lungs otherwise clear. Bronchovascular markings normal. No pleural effusions. Visualized bony thorax intact. IMPRESSION: Pneumonia versus hemorrhage involving the left lower lobe (mild patchy airspace opacities). Electronically Signed   By: Hulan Saashomas  Lawrence M.D.   On: 05/27/2017 17:51    Procedures Procedures (including critical care time)  Medications Ordered in ED Medications  ondansetron (ZOFRAN-ODT) disintegrating tablet 4 mg (4 mg Oral Given 05/27/17 1717)  HYDROcodone-acetaminophen (NORCO/VICODIN) 5-325 MG per tablet 1 tablet (1 tablet Oral Given 05/27/17 2010)  sodium chloride 0.9 % bolus 1,000 mL (0 mLs Intravenous Stopped 05/27/17 2055)     Initial Impression / Assessment and Plan / ED Course  I have reviewed the triage vital signs and the nursing notes.  Pertinent labs & imaging results that were available during my care of the patient were reviewed by me and considered in my medical decision making (see chart for details).   patient  with post tonsillectomy bleeding. Apparently self-limited. Seen in the ER by Dr. Pollyann Kennedyosen. Discharge home.  Final Clinical Impressions(s) / ED Diagnoses   Final diagnoses:  Post-tonsillectomy hemorrhage    New Prescriptions Discharge Medication List as of 05/27/2017  8:38 PM       Benjiman CorePickering, Naomee Nowland, MD 05/28/17 0100

## 2017-05-27 NOTE — ED Notes (Addendum)
While waiting in lobby for treatment room, patient reports that she started having central chest pain and SOB.  EKG completed, vital signs re-obtained and patient re-evaluated.   EKG given to Dr Clayborne DanaMesner who reported EKG looks normal and verified patient was stable to go back to lobby to continue to wait.

## 2017-05-27 NOTE — ED Triage Notes (Addendum)
Per [facility] patient from home c/o blood in vomit that started today after having feeling of fluids running down back of throat.  Patient states only had a banana to eat and that was around 5 am. Patient had tonsillectomy on 13th. Patient reports throat is sore but nothing worse than post-op pain.

## 2017-05-27 NOTE — Discharge Instructions (Signed)
[facility] for [facility].

## 2017-05-27 NOTE — Consult Note (Signed)
Reason for Consult: Post tonsillectomy hemorrhage Referring Physician: Davonna Belling, MD  [name] is an 36 y.o. female.  HPI: 7 days post tonsillectomy, started having some bleeding at about 2:00 this afternoon. She had several different episodes with vomiting blood and active bleeding that each lasted about half an hour. The last bleeding was at about 2 PM. No other known history of bleeding disorder. She has been drinking reasonably well since surgery. She complains of ear pain. She feels that the bleeding is coming from the right side.  Past Medical History:  Diagnosis Date  . Abnormal Pap smear   . Anxiety   . Asthma   . Depression   . Fibromyalgia   . Fibromyalgia muscle pain   . GERD (gastroesophageal reflux disease)   . Headache(784.0)    migraines  . Hypertension    no meds currently  . IBS (irritable bowel syndrome)   . Interstitial cystitis   . Mental disorder   . Normal pregnancy 05/16/2012  . PCOD (polycystic ovarian disease)   . PCOS (polycystic ovarian syndrome)   . Pre-diabetes   . SVD (spontaneous vaginal delivery) 05/17/2012    Past Surgical History:  Procedure Laterality Date  . APPENDECTOMY    . BREAST REDUCTION SURGERY    . CHOLECYSTECTOMY    . KNEE SURGERY      Family History  Problem Relation Age of Onset  . Hypertension Mother   . Asthma Mother   . Depression Mother   . Hypertension Maternal Grandmother   . Asthma Maternal Grandmother   . Diabetes Maternal Grandmother   . Heart attack Maternal Grandfather   . Heart disease Maternal Grandfather     Social History:  reports that she quit smoking about 8 years ago. She has never used smokeless tobacco. She reports that she does not drink alcohol or use drugs.  Allergies:  Allergies  Allergen Reactions  . Sulfa Antibiotics Swelling    Medications: Reviewed  Results for orders placed or performed during the hospital encounter of 05/27/17 (from the past 48 hour(s))  Basic metabolic  panel     Status: Abnormal   Collection Time: 05/27/17  4:51 PM  Result Value Ref Range   Sodium 139 135 - 145 mmol/L   Potassium 3.8 3.5 - 5.1 mmol/L   Chloride 102 101 - 111 mmol/L   CO2 28 22 - 32 mmol/L   Glucose, Bld 133 (H) 65 - 99 mg/dL   BUN 6 6 - 20 mg/dL   Creatinine, Ser 0.71 0.44 - 1.00 mg/dL   Calcium 9.0 8.9 - 10.3 mg/dL   GFR calc non Af Amer >60 >60 mL/min   GFR calc Af Amer >60 >60 mL/min    Comment: (NOTE) The eGFR has been calculated using the CKD EPI equation. This calculation has not been validated in all clinical situations. eGFR's persistently <60 mL/min signify possible Chronic Kidney Disease.    Anion gap 9 5 - 15  CBC     Status: None   Collection Time: 05/27/17  4:51 PM  Result Value Ref Range   WBC 10.4 4.0 - 10.5 K/uL   RBC 4.26 3.87 - 5.11 MIL/uL   Hemoglobin 12.4 12.0 - 15.0 g/dL   HCT 36.3 36.0 - 46.0 %   MCV 85.2 78.0 - 100.0 fL   MCH 29.1 26.0 - 34.0 pg   MCHC 34.2 30.0 - 36.0 g/dL   RDW 13.8 11.5 - 15.5 %   Platelets 293 150 - 400 K/uL  Dg Chest 2 View  Result Date: 05/27/2017 CLINICAL DATA:  Acute onset of hematemesis today associated with mid chest pain and shortness of breath. Postop day 6 from tonsillectomy. Former smoker. Current history of asthma and hypertension. EXAM: CHEST  2 VIEW COMPARISON:  08/22/2016, 08/06/2016 and earlier. FINDINGS: Cardiomediastinal silhouette unremarkable, unchanged. Patchy opacities involving the left lower lobe. Lungs otherwise clear. Bronchovascular markings normal. No pleural effusions. Visualized bony thorax intact. IMPRESSION: Pneumonia versus hemorrhage involving the left lower lobe (mild patchy airspace opacities). Electronically Signed   By: Evangeline Dakin M.D.   On: 05/27/2017 17:51    FWB:LTGAIDKS except as listed in admit H&P  Blood pressure 133/86, pulse 97, temperature 98.5 F (36.9 C), temperature source Oral, resp. rate 18, height _0  (1.626 m), weight 118.8 kg (262 lb), last menstrual  period 05/09/2017, SpO2 94 %, unknown if currently breastfeeding.  PHYSICAL EXAM: Overall appearance:  Healthy appearing, in no distress Head:  Normocephalic, atraumatic. Ears: External ears look healthy. Nose: External nose is healthy in appearance. Internal nasal exam free of any lesions or obstruction. Oral Cavity/Pharynx:  There are symmetric bilateral tonsil fossa eschar without any clot, active granulation tissue or fresh or old blood. Larynx/Hypopharynx: Deferred Neuro:  No identifiable neurologic deficits. Neck: No palpable neck masses.  Studies Reviewed: none  Procedures: none   Assessment/Plan: Post tonsillectomy bleed, self-limited. No active bleeding now, no blood clot or any evidence of suspicious area that appears to be at risk for further bleeding. We discussed different options including observation overnight in the hospital versus going home and returning if there is any additional bleeding. She and her husband are very comfortable going home and live about 15 or 20 minutes away. They were instructed to come back to the emergency room immediately if there is more bleeding and/or to call me directly. I also instructed her to try gargling with half-and-half ice water and peroxide which will sometimes stop the bleeding. Otherwise follow-up in the office as scheduled.  [name] 05/27/2017, 8:20 PM

## 2018-01-22 ENCOUNTER — Emergency Department (HOSPITAL_BASED_OUTPATIENT_CLINIC_OR_DEPARTMENT_OTHER)
Admission: EM | Admit: 2018-01-22 | Discharge: 2018-01-22 | Disposition: A | Discharge status: Home / Self Care | Payer: Self-pay | Attending: Emergency Medicine | Admitting: Emergency Medicine

## 2018-01-22 ENCOUNTER — Other Ambulatory Visit: Payer: Self-pay

## 2018-01-22 ENCOUNTER — Emergency Department (HOSPITAL_BASED_OUTPATIENT_CLINIC_OR_DEPARTMENT_OTHER): Payer: Self-pay

## 2018-01-22 ENCOUNTER — Encounter (HOSPITAL_BASED_OUTPATIENT_CLINIC_OR_DEPARTMENT_OTHER): Payer: Self-pay | Admitting: Emergency Medicine

## 2018-01-22 DIAGNOSIS — M25532 Pain in left wrist: Secondary | ICD-10-CM | POA: Insufficient documentation

## 2018-01-22 DIAGNOSIS — Z87891 Personal history of nicotine dependence: Secondary | ICD-10-CM | POA: Insufficient documentation

## 2018-01-22 DIAGNOSIS — W010XXD Fall on same level from slipping, tripping and stumbling without subsequent striking against object, subsequent encounter: Secondary | ICD-10-CM | POA: Insufficient documentation

## 2018-01-22 DIAGNOSIS — Z79899 Other long term (current) drug therapy: Secondary | ICD-10-CM | POA: Insufficient documentation

## 2018-01-22 DIAGNOSIS — E119 Type 2 diabetes mellitus without complications: Secondary | ICD-10-CM | POA: Insufficient documentation

## 2018-01-22 DIAGNOSIS — I10 Essential (primary) hypertension: Secondary | ICD-10-CM | POA: Insufficient documentation

## 2018-01-22 HISTORY — DX: Type 2 diabetes mellitus without complications: E11.9

## 2018-01-22 NOTE — ED Notes (Signed)
ED Provider at bedside. 

## 2018-01-22 NOTE — Discharge Instructions (Signed)
While in the ED your blood pressure was high.  Please follow up with your primary care doctor or the wellness clinic for repeat evaluation as you may need medication.  High blood pressure can cause long term, potentially serious, damage if left untreated.   Please follow up with the hand doctor.

## 2018-01-22 NOTE — ED Triage Notes (Signed)
L hand and wrist pain x 1 week after falling while ice skating. [facility]. Has been wearing a thumb spica velcro splint as directed. Pt states pain persists.

## 2018-01-23 NOTE — ED Provider Notes (Signed)
[facility] EMERGENCY DEPARTMENT Provider Note   CSN: [id number] Arrival date & time: 01/22/18  1457     History   Chief Complaint Chief Complaint  Patient presents with  . Wrist Pain    HPI [name] is a 37 y.o. female with a history of anxiety, fibromyalgia, DM, HTN, who presents today for evaluation of continued wrist pain.  In February she was ice skating and fell on the ice.  She was seen at urgent care, had negative x-ray and was given a thumb spica Velcro wrist splint.  She reports that she has been wearing this and that her pain is continuing.  She reports pain with all wrist movement and at the base of her thumb.  She denies fevers or chills.  No new injuries.    HPI  Past Medical History:  Diagnosis Date  . Abnormal Pap smear   . Anxiety   . Asthma   . Depression   . Diabetes mellitus without complication (HCC)   . Fibromyalgia   . Fibromyalgia muscle pain   . GERD (gastroesophageal reflux disease)   . Headache(784.0)    migraines  . Hypertension    no meds currently  . IBS (irritable bowel syndrome)   . Interstitial cystitis   . Mental disorder   . Normal pregnancy 05/16/2012  . PCOD (polycystic ovarian disease)   . PCOS (polycystic ovarian syndrome)   . Pre-diabetes   . SVD (spontaneous vaginal delivery) 05/17/2012    There are no active problems to display for this patient.   Past Surgical History:  Procedure Laterality Date  . APPENDECTOMY    . BREAST REDUCTION SURGERY    . CHOLECYSTECTOMY    . KNEE SURGERY      OB History    Gravida Para Term Preterm AB Living   3 2 2  0 1 2   SAB TAB Ectopic Multiple Live Births   1 0 0 0 2       Home Medications    Prior to Admission medications   Medication Sig Start Date End Date Taking? Authorizing Provider  albuterol (PROVENTIL HFA;VENTOLIN HFA) 108 (90 BASE) MCG/ACT inhaler Inhale 2 puffs into the lungs every 6 (six) hours as needed. For shortness of breath    Yes [provider]  ALPRAZolam (XANAX) 1 MG tablet Take 1 mg by mouth 2 (two) times daily.   Yes [provider]  cetirizine (ZYRTEC) 10 MG tablet Take 10 mg by mouth daily.   Yes [provider]  fluticasone (FLONASE) 50 MCG/ACT nasal spray Place 1 spray into both nostrils daily as needed for allergies. 01/27/16  Yes [provider]  venlafaxine (EFFEXOR) 50 MG tablet Take 50 mg by mouth 2 (two) times daily.   Yes [provider]  amoxicillin (AMOXIL) 500 MG capsule Take 1 capsule (500 mg total) by mouth 3 (three) times daily. Patient not taking: Reported on 05/27/2017 04/03/17   Dietrich Pates, PA-C  desvenlafaxine (PRISTIQ) 100 MG 24 hr tablet Take 100 mg by mouth at bedtime. 05/05/17   [provider]  dicyclomine (BENTYL) 20 MG tablet Take 1 tablet (20 mg total) by mouth 4 (four) times daily -  before meals and at bedtime. 03/30/17 05/27/17  Shaune Pollack, MD  gabapentin (NEURONTIN) 300 MG capsule Take 300 mg by mouth at bedtime.    [provider]  HYDROcodone-acetaminophen (HYCET) 7.5-325 mg/15 ml solution Take 15-20 mLs by mouth every 4 (four) hours as needed for pain.  05/21/17   [provider]  ibuprofen (ADVIL,MOTRIN) 100 MG/5ML suspension Take 600 mg by mouth every 4 (four) hours as needed for moderate pain.    [provider]  metFORMIN (GLUCOPHAGE) 500 MG tablet Take 500 mg by mouth 2 (two) times daily with a meal.    [provider]  metoprolol tartrate (LOPRESSOR) 50 MG tablet Take 75 mg by mouth daily. 04/26/17   [provider]  nitroGLYCERIN (NITROSTAT) 0.3 MG SL tablet Place 0.3 mg under the tongue every 5 (five) minutes as needed for chest pain.    [provider]  nortriptyline (PAMELOR) 10 MG capsule Take 10 mg by mouth at bedtime.    [provider]  nystatin (MYCOSTATIN) powder Apply 1 Bottle topically 4 (four) times daily.    [provider]  ondansetron (ZOFRAN ODT) 4  MG disintegrating tablet Take 1 tablet (4 mg total) by mouth every 8 (eight) hours as needed for nausea or vomiting. Patient not taking: Reported on 05/27/2017 04/04/17   Mackuen, Cindee Salt, MD  OVER THE COUNTER MEDICATION Take 1 capsule by mouth 2 (two) times daily.    [provider]  pantoprazole (PROTONIX) 40 MG tablet Take 40 mg by mouth 2 (two) times daily.    [provider]  promethazine (PHENERGAN) 25 MG tablet Take 1 tablet (25 mg total) by mouth every 6 (six) hours as needed for nausea or vomiting. Patient not taking: Reported on 05/27/2017 03/30/17   Shaune Pollack, MD  ranitidine (ZANTAC) 150 MG tablet Take 1 tablet (150 mg total) by mouth 2 (two) times daily. 03/30/17 04/06/17  Shaune Pollack, MD    Family History Family History  Problem Relation Age of Onset  . Hypertension Mother   . Asthma Mother   . Depression Mother   . Hypertension Maternal Grandmother   . Asthma Maternal Grandmother   . Diabetes Maternal Grandmother   . Heart attack Maternal Grandfather   . Heart disease Maternal Grandfather     Social History Social History   Tobacco Use  . Smoking status: Former Smoker    Last attempt to quit: 11/09/2008    Years since quitting: 9.2  . Smokeless tobacco: Never Used  Substance Use Topics  . Alcohol use: No  . Drug use: No     Allergies   Sulfa antibiotics   Review of Systems Review of Systems  Constitutional: Negative for chills and fever.  Musculoskeletal:       Pain in left wrist.   Skin: Negative for color change.  Neurological: Negative for weakness and numbness.     Physical Exam Updated Vital Signs BP (!) 172/84   Pulse (!) 102   Temp 99.8 F (37.7 C) (Oral)   Resp 18   Ht 5\' 4"  (1.626 m)   Wt 117.9 kg (260 lb)   LMP 01/08/2018   SpO2 99%   BMI 44.63 kg/m   Physical Exam  Constitutional: She is oriented to person, place, and time. She appears well-developed and well-nourished.  HENT:  Head: Normocephalic  and atraumatic.  Cardiovascular: Normal rate and intact distal pulses.  2+ left radial pusle  Musculoskeletal:  Left wrist is diffusely tender to palpation.  There is pain with active and passive range of motion of the left wrist.  There is pain when flexing and extending fingers.  There is tenderness to palpation over anatomic snuffbox.  Neurological: She is alert and oriented to person, place, and time.  Sensation intact to left wrist.  Skin: Skin is warm and dry. Capillary refill takes less than 2 seconds. She is not diaphoretic.  No redness, rashes, or obvious skin breaks over area of pain.  Nursing note and vitals reviewed.    ED Treatments / Results  Labs (all labs ordered are listed, but only abnormal results are displayed) Labs Reviewed - No data to display  EKG  EKG Interpretation None       Radiology Dg Wrist Complete Left  Result Date: 01/22/2018 CLINICAL DATA:  Injury to the left wrist approximately 1 month ago, persistent pain. Subsequent encounter. EXAM: LEFT WRIST - COMPLETE 3+ VIEW COMPARISON:  12/26/2017. FINDINGS: No evidence of acute or subacute fracture or dislocation. Well preserved joint spaces. Well-preserved bone mineral density. No intrinsic osseous abnormalities. Possible small joint effusion. IMPRESSION: No osseous abnormality. Electronically Signed   By: Hulan Saashomas  Lawrence M.D.   On: 01/22/2018 16:10    Procedures Procedures (including critical care time)  Medications Ordered in ED Medications - No data to display   Initial Impression / Assessment and Plan / ED Course  I have reviewed the triage vital signs and the nursing notes.  Pertinent labs & imaging results that were available during my care of the patient were reviewed by me and considered in my medical decision making (see chart for details).    Patient presents today for evaluation of one month of left wrist pain after falling while ice skating.  She had initial x-rays that were normal  and has been wearing a thumb spica splint since.  She has continued pain.  Repeat x-rays with questionable joint effusion, however no fractures or dislocations.  She has diffuse TTP across entire left wrist and with any movement of left wrist or fingers.  Unable to assess strength secondary to pain.  Patient was instructed to follow up with hand surgery for further evaluation.  She was instructed to continue conservative measures, and to take the brace off and start non weightbearing ROM.    Return precautions discussed, she stated understanding.   Final Clinical Impressions(s) / ED Diagnoses   Final diagnoses:  Hypertension, unspecified type  Left wrist pain    ED Discharge Orders    None       Norman ClayHammond, Ivah Girardot W, PA-C 01/23/18 0126    Tilden Fossaees, Chava Dulac, MD 01/24/18 1248

## 2018-02-28 ENCOUNTER — Encounter (HOSPITAL_BASED_OUTPATIENT_CLINIC_OR_DEPARTMENT_OTHER): Payer: Self-pay | Admitting: *Deleted

## 2018-02-28 ENCOUNTER — Emergency Department (HOSPITAL_BASED_OUTPATIENT_CLINIC_OR_DEPARTMENT_OTHER): Payer: BLUE CROSS/BLUE SHIELD

## 2018-02-28 ENCOUNTER — Emergency Department (HOSPITAL_BASED_OUTPATIENT_CLINIC_OR_DEPARTMENT_OTHER)
Admission: EM | Admit: 2018-02-28 | Discharge: 2018-02-28 | Disposition: A | Discharge status: Home / Self Care | Payer: BLUE CROSS/BLUE SHIELD | Attending: Emergency Medicine | Admitting: Emergency Medicine

## 2018-02-28 ENCOUNTER — Other Ambulatory Visit: Payer: Self-pay

## 2018-02-28 DIAGNOSIS — R05 Cough: Secondary | ICD-10-CM | POA: Diagnosis present

## 2018-02-28 DIAGNOSIS — J189 Pneumonia, unspecified organism: Secondary | ICD-10-CM

## 2018-02-28 DIAGNOSIS — R51 Headache: Secondary | ICD-10-CM | POA: Diagnosis not present

## 2018-02-28 DIAGNOSIS — R079 Chest pain, unspecified: Secondary | ICD-10-CM | POA: Insufficient documentation

## 2018-02-28 DIAGNOSIS — Z79899 Other long term (current) drug therapy: Secondary | ICD-10-CM | POA: Insufficient documentation

## 2018-02-28 DIAGNOSIS — Z7984 Long term (current) use of oral hypoglycemic drugs: Secondary | ICD-10-CM | POA: Insufficient documentation

## 2018-02-28 DIAGNOSIS — Z87891 Personal history of nicotine dependence: Secondary | ICD-10-CM | POA: Insufficient documentation

## 2018-02-28 DIAGNOSIS — E119 Type 2 diabetes mellitus without complications: Secondary | ICD-10-CM | POA: Insufficient documentation

## 2018-02-28 DIAGNOSIS — R0602 Shortness of breath: Secondary | ICD-10-CM | POA: Insufficient documentation

## 2018-02-28 DIAGNOSIS — I10 Essential (primary) hypertension: Secondary | ICD-10-CM | POA: Diagnosis not present

## 2018-02-28 DIAGNOSIS — J45909 Unspecified asthma, uncomplicated: Secondary | ICD-10-CM | POA: Insufficient documentation

## 2018-02-28 MED ORDER — AZITHROMYCIN 250 MG PO TABS
500.0000 mg | ORAL_TABLET | Freq: Once | ORAL | Status: AC
  Administered 2018-02-28: 500 mg via ORAL
  Filled 2018-02-28: qty 2

## 2018-02-28 MED ORDER — AZITHROMYCIN 250 MG PO TABS: 250.0000 mg | ORAL_TABLET | Freq: Every day | ORAL | 0 refills | Status: DC

## 2018-02-28 MED ORDER — AMOXICILLIN-POT CLAVULANATE ER 1000-62.5 MG PO TB12: 2.0000 | ORAL_TABLET | Freq: Two times a day (BID) | ORAL | 0 refills | Status: AC

## 2018-02-28 MED ORDER — AMOXICILLIN-POT CLAVULANATE ER 1000-62.5 MG PO TB12
2.0000 | ORAL_TABLET | Freq: Once | ORAL | Status: DC
  Filled 2018-02-28: qty 2

## 2018-02-28 NOTE — ED Notes (Signed)
Pt. Reports she has had a cold for a week and started having shortness of breath and a cough since Sat.  Coughing up green phlegm.

## 2018-02-28 NOTE — ED Triage Notes (Signed)
Cough x 4 days with green sputum. Soreness in her chest with cough. She has asthma and has been using her inhaler. Started with a sore throat and fever over a week ago.

## 2018-02-28 NOTE — ED Notes (Signed)
Pt. Also reports she has just felt weak and tired.

## 2018-02-28 NOTE — ED Provider Notes (Signed)
[facility] EMERGENCY DEPARTMENT Provider Note   CSN: [id number] Arrival date & time: 02/28/18  1929     History   Chief Complaint Chief Complaint  Patient presents with  . Cough  . Shortness of Breath    HPI [name] is a 37 y.o. female.   37 year old female with a history of diabetes, asthma presents with cough and shortness of breath.  A little over a week ago she had a fever and sore throat that resolved spontaneously.  Now over the last 4 days she has been having cough with green sputum and left-sided and midsternal chest pain.  These pains are sharp.  He does not feel like pain she has had from coughing and injuring her muscles before.  She also feels short of breath.  She is tried her inhaler for the coughing but it has not helped.  She has not had a fever this time.  She is a little sore throat when she coughs.  She is been having a headache for the last couple weeks since the cold started and this headache has remained stable.  She has tried Aleve for the headache.  Past Medical History:  Diagnosis Date  . Abnormal Pap smear   . Anxiety   . Asthma   . Depression   . Diabetes mellitus without complication (HCC)   . Fibromyalgia   . Fibromyalgia muscle pain   . GERD (gastroesophageal reflux disease)   . Headache(784.0)    migraines  . Hypertension    no meds currently  . IBS (irritable bowel syndrome)   . Interstitial cystitis   . Mental disorder   . Normal pregnancy 05/16/2012  . PCOD (polycystic ovarian disease)   . PCOS (polycystic ovarian syndrome)   . Pre-diabetes   . SVD (spontaneous vaginal delivery) 05/17/2012    There are no active problems to display for this patient.   Past Surgical History:  Procedure Laterality Date  . APPENDECTOMY    . BREAST REDUCTION SURGERY    . CHOLECYSTECTOMY    . KNEE SURGERY       OB History    Gravida  3   Para  2   Term  2   Preterm  0   AB  1   Living  2     SAB  1   TAB  0   Ectopic    0   Multiple  0   Live Births  2            Home Medications    Prior to Admission medications   Medication Sig Start Date End Date Taking? Authorizing Provider  albuterol (PROVENTIL HFA;VENTOLIN HFA) 108 (90 BASE) MCG/ACT inhaler Inhale 2 puffs into the lungs every 6 (six) hours as needed. For shortness of breath     [provider]  ALPRAZolam (XANAX) 1 MG tablet Take 1 mg by mouth 2 (two) times daily.    [provider]  amoxicillin (AMOXIL) 500 MG capsule Take 1 capsule (500 mg total) by mouth 3 (three) times daily. Patient not taking: Reported on 05/27/2017 04/03/17   Dietrich Pates, PA-C  amoxicillin-clavulanate (AUGMENTIN XR) 1000-62.5 MG 12 hr tablet Take 2 tablets by mouth 2 (two) times daily for 7 days. 03/01/18 03/08/18  Pricilla Loveless, MD  azithromycin (ZITHROMAX) 250 MG tablet Take 1 tablet (250 mg total) by mouth daily. 03/01/18   Pricilla Loveless, MD  cetirizine (ZYRTEC) 10 MG tablet Take 10 mg by mouth daily.  [provider]  desvenlafaxine (PRISTIQ) 100 MG 24 hr tablet Take 100 mg by mouth at bedtime. 05/05/17   [provider]  dicyclomine (BENTYL) 20 MG tablet Take 1 tablet (20 mg total) by mouth 4 (four) times daily -  before meals and at bedtime. 03/30/17 05/27/17  Shaune Pollack, MD  fluticasone (FLONASE) 50 MCG/ACT nasal spray Place 1 spray into both nostrils daily as needed for allergies. 01/27/16   [provider]  gabapentin (NEURONTIN) 300 MG capsule Take 300 mg by mouth at bedtime.    [provider]  HYDROcodone-acetaminophen (HYCET) 7.5-325 mg/15 ml solution Take 15-20 mLs by mouth every 4 (four) hours as needed for pain. 05/21/17   [provider]  ibuprofen (ADVIL,MOTRIN) 100 MG/5ML suspension Take 600 mg by mouth every 4 (four) hours as needed for moderate pain.    [provider]  metFORMIN (GLUCOPHAGE) 500 MG tablet Take 500 mg by mouth 2 (two) times daily with a meal.    [provider]  metoprolol tartrate (LOPRESSOR) 50 MG tablet Take 75 mg by mouth daily. 04/26/17   [provider]  nitroGLYCERIN (NITROSTAT) 0.3 MG SL tablet Place 0.3 mg under the tongue every 5 (five) minutes as needed for chest pain.    [provider]  nortriptyline (PAMELOR) 10 MG capsule Take 10 mg by mouth at bedtime.    [provider]  nystatin (MYCOSTATIN) powder Apply 1 Bottle topically 4 (four) times daily.    [provider]  ondansetron (ZOFRAN ODT) 4 MG disintegrating tablet Take 1 tablet (4 mg total) by mouth every 8 (eight) hours as needed for nausea or vomiting. Patient not taking: Reported on 05/27/2017 04/04/17   Mackuen, Cindee Salt, MD  OVER THE COUNTER MEDICATION Take 1 capsule by mouth 2 (two) times daily.    [provider]  pantoprazole (PROTONIX) 40 MG tablet Take 40 mg by mouth 2 (two) times daily.    [provider]  promethazine (PHENERGAN) 25 MG tablet Take 1 tablet (25 mg total) by mouth every 6 (six) hours as needed for nausea or vomiting. Patient not taking: Reported on 05/27/2017 03/30/17   Shaune Pollack, MD  ranitidine (ZANTAC) 150 MG tablet Take 1 tablet (150 mg total) by mouth 2 (two) times daily. 03/30/17 04/06/17  Shaune Pollack, MD  venlafaxine (EFFEXOR) 50 MG tablet Take 50 mg by mouth 2 (two) times daily.    [provider]    Family History Family History  Problem Relation Age of Onset  . Hypertension Mother   . Asthma Mother   . Depression Mother   . Hypertension Maternal Grandmother   . Asthma Maternal Grandmother   . Diabetes Maternal Grandmother   . Heart attack Maternal Grandfather   . Heart disease Maternal Grandfather     Social History Social History   Tobacco Use  . Smoking status: Former Smoker    Last attempt to quit: 11/09/2008    Years since quitting: 9.3  . Smokeless tobacco: Never Used  Substance Use Topics  . Alcohol use: No  . Drug use: No     Allergies     Ciprofloxacin and Sulfa antibiotics   Review of Systems Review of Systems  Constitutional: Negative for fever.  HENT: Positive for congestion and sore throat.   Respiratory: Positive for cough and shortness of breath.   Cardiovascular: Positive for chest pain.  Gastrointestinal: Positive for vomiting (once last night). Negative for abdominal pain.  Neurological: Positive for headaches.  All other systems reviewed and are negative.    Physical Exam Updated Vital Signs BP (!) 156/63 (BP Location: Left Arm)   Pulse 88   Temp 98.5 F (36.9 C) (Oral)   Resp 18   Ht  (1.626 m)   Wt 122.5 kg (270 lb)   LMP 12/31/2017 (Approximate)   SpO2 100%   BMI 46.35 kg/m   Physical Exam  Constitutional: She is oriented to person, place, and time. She appears well-developed and well-nourished.  Non-toxic appearance. She does not appear ill. No distress.  obese  HENT:  Head: Normocephalic and atraumatic.  Right Ear: External ear normal.  Left Ear: External ear normal.  Nose: Nose normal.  Eyes: Right eye exhibits no discharge. Left eye exhibits no discharge.  Cardiovascular: Regular rhythm and normal heart sounds. Tachycardia present.  HR~100  Pulmonary/Chest: Effort normal and breath sounds normal. She has no wheezes. She has no rhonchi. She has no rales. She exhibits no tenderness.  Abdominal: Soft. There is no tenderness.  Neurological: She is alert and oriented to person, place, and time.  Skin: Skin is warm and dry.  Nursing note and vitals reviewed.    ED Treatments / Results  Labs (all labs ordered are listed, but only abnormal results are displayed) Labs Reviewed - No data to display  EKG EKG Interpretation  Date/Time:  Monday February 28 2018 21:31:06 EDT Ventricular Rate:  97 PR Interval:    QRS Duration: 92 QT Interval:  372 QTC Calculation: 473 R Axis:   19 Text Interpretation:  Normal sinus rhythm Borderline T abnormalities, anterior leads no significant  change since July 2018 Confirmed by Pricilla Loveless [phone]) on 02/28/2018 10:06:52 PM   Radiology Dg Chest 2 View  Result Date: 02/28/2018 CLINICAL DATA:  Left lower anterior chest pain, congestion fever and sore throat. EXAM: CHEST - 2 VIEW COMPARISON:  CT of the chest 08/26/2017 FINDINGS: Cardiomediastinal silhouette is normal. Mediastinal contours appear intact. There is no evidence of lobar airspace consolidation, pleural effusion or pneumothorax. Peribronchial airspace opacities in bilateral lower lobes. Osseous structures are without acute abnormality. Soft tissues are grossly normal. IMPRESSION: Peribronchial streaky airspace opacities in bilateral lower lobes may represent acute bronchitis or early bronchopneumonia. No definite lobar consolidations seen. Electronically Signed   By: Ted Mcalpine M.D.   On: 02/28/2018 21:47    Procedures Procedures (including critical care time)  Medications Ordered in ED Medications  amoxicillin-clavulanate (AUGMENTIN XR) 1000-62.5 MG per 12 hr tablet 2 tablet (2 tablets Oral Not Given 02/28/18 2232)  azithromycin (ZITHROMAX) tablet 500 mg (500 mg Oral Given 02/28/18 2229)     Initial Impression / Assessment and Plan / ED Course  I have reviewed the triage vital signs and the nursing notes.  Pertinent labs & imaging results that were available during my care of the patient were reviewed by me and considered in my medical decision making (see chart for details).     Patient's chest x-ray shows mild pneumonia which is consistent with her symptoms of productive cough and dyspnea.  Given her history of diabetes and asthma she will be covered with Augmentin and azithromycin.  She is mildly tachycardic but not hypoxic and in no respiratory distress.  I think PE is quite unlikely and do not think workup needed.  Highly doubt ACS/dissection.  Return precautions.  Final Clinical Impressions(s) / ED Diagnoses   Final diagnoses:  Community acquired  pneumonia, unspecified laterality    ED Discharge Orders  Ordered    amoxicillin-clavulanate (AUGMENTIN XR) 1000-62.5 MG 12 hr tablet  2 times daily     02/28/18 2214    azithromycin (ZITHROMAX) 250 MG tablet  Daily     02/28/18 2214       Pricilla Loveless, MD 03/01/18 0028

## 2018-03-01 MED FILL — AZITHROMYCIN 250 MG TABLET: 250 | 4 days supply | Qty: 4 | Fill #0

## 2018-03-01 MED FILL — AMOXICILLIN-CLAV ER 1,000-6: 1000-62.5 | 7 days supply | Qty: 26 | Fill #0

## 2019-05-02 ENCOUNTER — Encounter: Payer: Self-pay | Admitting: Cardiology

## 2019-05-02 ENCOUNTER — Other Ambulatory Visit: Payer: Self-pay

## 2019-05-02 ENCOUNTER — Ambulatory Visit: Payer: BLUE CROSS/BLUE SHIELD | Admitting: Cardiology

## 2019-05-02 DIAGNOSIS — E7800 Pure hypercholesterolemia, unspecified: Secondary | ICD-10-CM | POA: Insufficient documentation

## 2019-05-02 DIAGNOSIS — I493 Ventricular premature depolarization: Secondary | ICD-10-CM | POA: Diagnosis not present

## 2019-05-02 DIAGNOSIS — I119 Hypertensive heart disease without heart failure: Secondary | ICD-10-CM | POA: Diagnosis not present

## 2019-05-02 NOTE — Progress Notes (Signed)
Subjective:  Primary Physician:  Verdell Carmine., MD  Patient ID: [name], female    DOB: 27-Jan-1981, 38 y.o.   MRN: [id number] This visit type was conducted due to national recommendations for restrictions regarding the COVID-19 Pandemic (e.g. social distancing).  This format is felt to be most appropriate for this patient at this time.  All issues noted in this document were discussed and addressed.  No physical exam was performed (except for noted visual exam findings with Telehealth visits - very limited).  The patient has consented to conduct a Telehealth visit and understands insurance will be billed.   I connected with patient, on 05/02/19  by a video enabled telemedicine application and verified that I am speaking with the correct person using two identifiers.     I discussed the limitations of evaluation and management by telemedicine and the availability of in person appointments. The patient expressed understanding and agreed to proceed.   I have discussed with patient regarding the safety during COVID Pandemic and steps and precautions including social distancing with the patient.   Chief Complaint  Patient presents with  . Hypertension  . Palpitations    HPI: [name]  is a 38 y.o. female, who presents for follow-up of hypertensive heart disease.  Patient is feeling well, denies any complaints of chest pain or shortness of breath.  No orthopnea or PND.  Patient has mild dependent leg edema.It has been controlled with compression stockings. She had borderline reduced ejection fraction by echocardiogram but normal BNP. There is no pain in the legs and no known history of DVT. Her CT scan of the chest in October 2018  did not reveal any pulmonary embolism.  She has occasional palpitation like "flutter" in the chest. No complaints of dizziness, near syncope or syncope.  She has history of polycystic ovarian disease. Patient has hypertension, diabetes mellitus type 2  (controlled with therapy) and hypercholesterolemia. She does not smoke. She gives history of irritable bowel syndrome. No history of thyroid problems. Patient has morbid obesity.   Past Medical History:  Diagnosis Date  . Abnormal Pap smear   . Anxiety   . Asthma   . Depression   . Diabetes mellitus without complication ([place])   . Fibromyalgia   . Fibromyalgia muscle pain   . GERD (gastroesophageal reflux disease)   . Headache(784.0)    migraines  . Hypertension    no meds currently  . IBS (irritable bowel syndrome)   . Interstitial cystitis   . Mental disorder   . Normal pregnancy 05/16/2012  . PCOD (polycystic ovarian disease)   . PCOS (polycystic ovarian syndrome)   . Pre-diabetes   . SVD (spontaneous vaginal delivery) 05/17/2012    Past Surgical History:  Procedure Laterality Date  . APPENDECTOMY    . BREAST REDUCTION SURGERY    . CHOLECYSTECTOMY    . KNEE SURGERY      Social History   Socioeconomic History  . Marital status: Married    Spouse name: Not on file  . Number of children: 2  . Years of education: Not on file  . Highest education level: Not on file  Occupational History  . Not on file  Social Needs  . Financial resource strain: Not on file  . Food insecurity    Worry: Not on file    Inability: Not on file  . Transportation needs    Medical: Not on file    Non-medical: Not on file  Tobacco  Use  . Smoking status: Former Smoker    Quit date: 11/09/2008    Years since quitting: 10.4  . Smokeless tobacco: Never Used  Substance and Sexual Activity  . Alcohol use: No  . Drug use: No  . Sexual activity: Yes    Birth control/protection: None  Lifestyle  . Physical activity    Days per week: Not on file    Minutes per session: Not on file  . Stress: Not on file  Relationships  . Social Musicianconnections    Talks on phone: Not on file    Gets together: Not on file    Attends religious service: Not on file    Active member of club or organization: Not on  file    Attends meetings of clubs or organizations: Not on file    Relationship status: Not on file  . Intimate partner violence    Fear of current or ex partner: Not on file    Emotionally abused: Not on file    Physically abused: Not on file    Forced sexual activity: Not on file  Other Topics Concern  . Not on file  Social History Narrative  . Not on file    Current Outpatient Medications on File Prior to Visit  Medication Sig Dispense Refill  . albuterol (PROVENTIL HFA;VENTOLIN HFA) 108 (90 BASE) MCG/ACT inhaler Inhale 2 puffs into the lungs every 6 (six) hours as needed. For shortness of breath     . ALPRAZolam (XANAX) 1 MG tablet Take 1 mg by mouth 2 (two) times daily.    . cetirizine (ZYRTEC) 10 MG tablet Take 10 mg by mouth daily.    . Cholecalciferol 1.25 MG (50000 UT) TABS cholecalciferol (vitamin D3) 1,250 mcg (50,000 unit) capsule  TAKE ONE CAPSULE BY MOUTH EVERY WEEK    . dicyclomine (BENTYL) 20 MG tablet Take 1 tablet (20 mg total) by mouth 4 (four) times daily -  before meals and at bedtime. 40 tablet 0  . fluticasone (FLONASE) 50 MCG/ACT nasal spray Place 1 spray into both nostrils daily as needed for allergies.    Marland Kitchen. gabapentin (NEURONTIN) 300 MG capsule Take 300 mg by mouth at bedtime.    . lamoTRIgine (LAMICTAL) 100 MG tablet lamotrigine 100 mg tablet  TAKE ONE TABLET BY MOUTH ONE TIME DAILY    . liraglutide (VICTOZA) 18 MG/3ML SOPN Victoza 3-Pak 0.6 mg/0.1 mL (18 mg/3 mL) subcutaneous pen injector  TITRATE UP TO INJECT 1.8MG  UNDER THE SKIN ONE TIME DAILY AS DIRECTED    . lisinopril (ZESTRIL) 2.5 MG tablet lisinopril 2.5 mg tablet  TAKE ONE TABLET BY MOUTH ONE TIME DAILY    . meloxicam (MOBIC) 15 MG tablet meloxicam 15 mg tablet  TAKE ONE TABLET BY MOUTH ONE TIME DAILY    . metoprolol succinate (TOPROL-XL) 100 MG 24 hr tablet metoprolol succinate ER 100 mg tablet,extended release 24 hr  TAKE ONE TABLET BY MOUTH ONE TIME DAILY    . nitroGLYCERIN (NITROSTAT) 0.3 MG  SL tablet Place 0.3 mg under the tongue every 5 (five) minutes as needed for chest pain.    . nortriptyline (PAMELOR) 10 MG capsule Take 10 mg by mouth at bedtime.    Marland Kitchen. nystatin (MYCOSTATIN) powder Apply 1 Bottle topically 4 (four) times daily.    . Omega-3 1000 MG CAPS Take by mouth.    . pantoprazole (PROTONIX) 40 MG tablet Take 40 mg by mouth 2 (two) times daily.    . pravastatin (PRAVACHOL) 10 MG  tablet Take 10 mg by mouth daily.     No current facility-administered medications on file prior to visit.    Review of Systems  Constitutional: Negative for fever.  HENT: Negative for nosebleeds.   Eyes: Negative for blurred vision.  Respiratory: Negative for cough and shortness of breath.   Cardiovascular: Positive for palpitations (occasional) and leg swelling (mild, dependent edema). Negative for chest pain.  Gastrointestinal: Negative for abdominal pain, nausea and vomiting.  Genitourinary: Negative for dysuria.  Musculoskeletal: Negative for myalgias.  Skin: Negative for itching and rash.  Neurological: Negative for dizziness, seizures and loss of consciousness.  Psychiatric/Behavioral: The patient is not nervous/anxious.        Objective:  Height 5\' 4"  (1.626 m), weight 247 lb (112 kg), unknown if currently breastfeeding. Body mass index is 42.4 kg/m.  Physical Exam: Patient is alert and oriented, very obese, appeared very comfortable talking to me during the visit. No further detailed physical examination was possible as it was a telemedicine visit.  CARDIAC STUDIES:   Echocardiogram 04/27/2018: Left ventricle cavity is normal in size. Mild concentric hypertrophy of the left ventricle. Low normal LV systolic function. Normal diastolic filling pattern. Left ventricle regional wall motion findings: No wall motion abnormalities. Calculated EF 50%. No significant valvular abnormalities.  Zio Patch event monitor 12 days 07/10/2015: Underlying rhythm is normal sinus rhythm. Minimum  heart rate 53, maximum heart rate 179 bpm, rare PVCs and PACs, symptomatic events, 7 in number reveal normal sinus rhythm. One Ventricular couplets was evident.  Assessment & Recommendations:   1. Hypertension with heart disease  2. Premature ventricular contractions (PVCs) (VPCs)  3. Morbid obesity (HCC)  Laboratory Exam:  CBC Latest Ref Rng & Units 05/27/2017 03/30/2017 04/30/2015  WBC 4.0 - 10.5 K/uL 10.4 11.6(H) 8.8  Hemoglobin 12.0 - 15.0 g/dL 16.112.4 09.614.6 04.513.2  Hematocrit 36.0 - 46.0 % 36.3 41.9 38.0  Platelets 150 - 400 K/uL 293 297 267   CMP Latest Ref Rng & Units 05/27/2017 03/30/2017 04/30/2015  Glucose 65 - 99 mg/dL 409(W133(H) 119(J130(H) 478(G124(H)  BUN 6 - 20 mg/dL 6 14 10   Creatinine 0.44 - 1.00 mg/dL 9.560.71 2.130.74 0.860.74  Sodium 135 - 145 mmol/L 139 136 136  Potassium 3.5 - 5.1 mmol/L 3.8 4.1 4.1  Chloride 101 - 111 mmol/L 102 103 101  CO2 22 - 32 mmol/L 28 25 24   Calcium 8.9 - 10.3 mg/dL 9.0 9.3 9.1  Total Protein 6.5 - 8.1 g/dL - 7.9 -  Total Bilirubin 0.3 - 1.2 mg/dL - 0.5 -  Alkaline Phos 38 - 126 U/L - 57 -  AST 15 - 41 U/L - 18 -  ALT 14 - 54 U/L - 22 -   Lipid Panel  No results found for: CHOL, TRIG, HDL, CHOLHDL, VLDL, LDLCALC, LDLDIRECT   Recommendation:  Patient is not monitoring blood pressure at home but her last blood pressure check was normal.  Palpitation is occasional.  I have advised her to continue present medications and have also advised to check blood pressure at home regularly.  Minimal Leg edema is due to venous insufficiency has improved with use of compression stockings. She was advised to continue using them and continue elevating the legs while lying down.  Primary prevention was again discussed with the patient. She was advised to follow strict ADA, low-salt, low-cholesterol diet and restrict calories to lose weight. She was also encouraged to walk regularly or do other aerobic exercises.  She was advised to avoid caffeine,  decongestants or any other  stimulants.  Return for cardiac follow-up after 6 months but call us earlier if there are any cardiac problems or blood pressure is uncontrolled. We will get her blood test results from PCP.  Earl Manyhandra K Malijah Lietz, MD, [facility] 05/02/2019, 11:57 AM [facility] [place] Pager: [phone] Office: [phone] If no answer Cell [phone]

## 2019-05-29 ENCOUNTER — Telehealth: Payer: Self-pay

## 2019-05-29 NOTE — Telephone Encounter (Signed)
Patient called and said that over the weekend she experienced, chest pain,which she took a Nitro tablet for which helped. She also said that she felt an extreme  Thump/palpitations which  adventually got better.

## 2019-05-30 ENCOUNTER — Other Ambulatory Visit: Payer: Self-pay | Admitting: Cardiology

## 2019-05-30 DIAGNOSIS — R079 Chest pain, unspecified: Secondary | ICD-10-CM

## 2019-05-30 NOTE — Progress Notes (Signed)
Patient called our office, said that 2 days ago, she had a spell of chest pain and palpitation.  She was just sitting in the bed and felt palpitation like skipping in the heart followed by strong thumping beats.  She also felt pressure-like sensation in the substernal region "like a rubber band around my chest".  There was no radiation to the arms or neck.  She had mild shortness of breath but no diaphoresis, nausea or vomiting.  She took one nitroglycerin and chest pain subsided in 5 minutes.  There was no recurrence of chest pain.  She denies any exertional chest pain.  In view of her symptoms and the risk factors for CAD, I have scheduled Lexiscan Myoview scans.  It was explained to the patient.  I also checked about pregnancy status and patient said she is not pregnant and not planning to become pregnant.  Her symptoms of palpitation and a PO2 be due to PVCs, we will just observe at this time.  She was advised to call if symptoms get worse.

## 2019-06-05 ENCOUNTER — Ambulatory Visit: Payer: BLUE CROSS/BLUE SHIELD

## 2019-06-05 ENCOUNTER — Other Ambulatory Visit: Payer: Self-pay

## 2019-06-13 ENCOUNTER — Telehealth: Payer: Self-pay | Admitting: Cardiology

## 2019-06-13 NOTE — Telephone Encounter (Signed)
-----   Message from Despina Hick, MD sent at 06/05/2019  9:24 AM EDT ----- Regarding: RE: Nuc, scans Thank you. Please save this information in chart. ----- Message ----- From: Max Fickle Sent: 06/05/2019   9:07 AM EDT To: Despina Hick, MD Subject: RE: Nuc, scans                                 Dr Woody Seller,  The patient now has the [place] ins that designates she MUST use [facility] providers or her claims will be "out of network" and she is considered self-pay at our office.  The patient is now aware of this.  Therefore, her nuclear stress test for today has been cancelled as well as the follow up appt. ----- Message ----- From: Despina Hick, MD Sent: 05/31/2019   4:14 PM EDT To: Max Fickle Subject: RE: Nuc, scans                                 It will depend on results.  ----- Message ----- From: Max Fickle Sent: 05/31/2019   9:39 AM EDT To: Despina Hick, MD Subject: FW: Nuc, scans                                 The lexiscan is scheduled for Mon 7/27 @ 8:45.  Does she need a f/u appt or will that depend on the results? ----- Message ----- From: Despina Hick, MD Sent: 05/30/2019  10:46 AM EDT To: Maudry Mayhew Shawneequa Baldridge Subject: Nuc, scans                                     I have scheduled  Lexiscan Myoview scans.  Please scheduled this week or next week.  Thanks.

## 2019-10-31 ENCOUNTER — Ambulatory Visit: Payer: BLUE CROSS/BLUE SHIELD | Admitting: Cardiology
